# Patient Record
Sex: Female | Born: 1955 | Race: White | Hispanic: No | Marital: Married | Smoking: Former smoker
Health system: Southern US, Community
[De-identification: ages and names within clinical notes are randomized; demographics above are authoritative.]

## PROBLEM LIST (undated history)

## (undated) DIAGNOSIS — I10 Essential (primary) hypertension: Secondary | ICD-10-CM

## (undated) DIAGNOSIS — M1990 Unspecified osteoarthritis, unspecified site: Secondary | ICD-10-CM

## (undated) DIAGNOSIS — E039 Hypothyroidism, unspecified: Secondary | ICD-10-CM

## (undated) DIAGNOSIS — N939 Abnormal uterine and vaginal bleeding, unspecified: Secondary | ICD-10-CM

## (undated) DIAGNOSIS — E109 Type 1 diabetes mellitus without complications: Secondary | ICD-10-CM

## (undated) DIAGNOSIS — F32A Depression, unspecified: Secondary | ICD-10-CM

## (undated) DIAGNOSIS — F329 Major depressive disorder, single episode, unspecified: Secondary | ICD-10-CM

## (undated) DIAGNOSIS — M6530 Trigger finger, unspecified finger: Secondary | ICD-10-CM

## (undated) DIAGNOSIS — R519 Headache, unspecified: Secondary | ICD-10-CM

## (undated) DIAGNOSIS — E785 Hyperlipidemia, unspecified: Secondary | ICD-10-CM

---
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
## 2005-05-01 ENCOUNTER — Ambulatory Visit: Payer: Self-pay | Admitting: Family Medicine

## 2006-02-03 ENCOUNTER — Ambulatory Visit: Payer: Self-pay | Admitting: Unknown Physician Specialty

## 2006-05-06 ENCOUNTER — Ambulatory Visit: Payer: Self-pay | Admitting: Unknown Physician Specialty

## 2006-06-17 ENCOUNTER — Encounter: Payer: Self-pay | Admitting: Specialist

## 2006-06-19 ENCOUNTER — Encounter: Payer: Self-pay | Admitting: Specialist

## 2006-07-18 ENCOUNTER — Encounter: Payer: Self-pay | Admitting: Specialist

## 2006-08-27 ENCOUNTER — Ambulatory Visit: Payer: Self-pay | Admitting: Family Medicine

## 2007-02-11 ENCOUNTER — Ambulatory Visit: Payer: Self-pay | Admitting: Obstetrics and Gynecology

## 2007-09-16 ENCOUNTER — Ambulatory Visit: Payer: Self-pay | Admitting: Family Medicine

## 2007-09-20 ENCOUNTER — Ambulatory Visit: Payer: Self-pay | Admitting: Family Medicine

## 2008-10-31 ENCOUNTER — Ambulatory Visit: Payer: Self-pay | Admitting: Family Medicine

## 2009-10-08 ENCOUNTER — Ambulatory Visit: Payer: Self-pay | Admitting: Internal Medicine

## 2009-10-17 ENCOUNTER — Ambulatory Visit: Payer: Self-pay | Admitting: Internal Medicine

## 2009-10-31 ENCOUNTER — Ambulatory Visit: Payer: Self-pay | Admitting: Internal Medicine

## 2009-11-14 ENCOUNTER — Ambulatory Visit: Payer: Self-pay | Admitting: Internal Medicine

## 2010-06-21 ENCOUNTER — Ambulatory Visit: Payer: Self-pay | Admitting: Family Medicine

## 2010-07-02 ENCOUNTER — Ambulatory Visit: Payer: Self-pay

## 2010-08-06 ENCOUNTER — Ambulatory Visit: Payer: Self-pay | Admitting: Family Medicine

## 2011-08-29 ENCOUNTER — Ambulatory Visit: Payer: Self-pay | Admitting: Family Medicine

## 2013-06-20 ENCOUNTER — Ambulatory Visit: Payer: Self-pay | Admitting: Physician Assistant

## 2013-06-22 LAB — URINE CULTURE

## 2013-11-23 ENCOUNTER — Ambulatory Visit: Payer: Self-pay | Admitting: Family Medicine

## 2013-12-12 ENCOUNTER — Ambulatory Visit: Payer: Self-pay | Admitting: Unknown Physician Specialty

## 2013-12-22 ENCOUNTER — Ambulatory Visit: Payer: Self-pay | Admitting: Unknown Physician Specialty

## 2013-12-26 LAB — PATHOLOGY REPORT

## 2014-03-19 HISTORY — PX: COLONOSCOPY: SHX174

## 2014-04-03 ENCOUNTER — Ambulatory Visit (INDEPENDENT_AMBULATORY_CARE_PROVIDER_SITE_OTHER): Payer: Federal, State, Local not specified - PPO

## 2014-04-03 ENCOUNTER — Ambulatory Visit (INDEPENDENT_AMBULATORY_CARE_PROVIDER_SITE_OTHER): Payer: Federal, State, Local not specified - PPO | Admitting: Podiatry

## 2014-04-03 ENCOUNTER — Encounter: Payer: Self-pay | Admitting: Podiatry

## 2014-04-03 VITALS — BP 135/73 | HR 70 | Resp 16 | Ht 66.0 in | Wt 218.0 lb

## 2014-04-03 DIAGNOSIS — E109 Type 1 diabetes mellitus without complications: Secondary | ICD-10-CM

## 2014-04-03 DIAGNOSIS — M722 Plantar fascial fibromatosis: Secondary | ICD-10-CM

## 2014-04-03 DIAGNOSIS — E1042 Type 1 diabetes mellitus with diabetic polyneuropathy: Secondary | ICD-10-CM

## 2014-04-03 NOTE — Progress Notes (Signed)
   Subjective:    Patient ID: Elaine Nelson, female    DOB: 12/04/1955, 58 y.o.   MRN: [id number]  HPI Comments: i have pain with both feet on the bottom, and pain in between 4th and 5th toes. Ive had the pain for 1 month with the toes and the feet for 2 years. The pain in between the toes are getting worse. Standing will bother me. Shoes do not bother me. The pain is off and on. i soak my feet in epsom salt, and take tylenol.  Foot Pain Associated symptoms include coughing.      Review of Systems  Eyes: Positive for visual disturbance.  Respiratory: Positive for cough.   Endocrine:       Increase urination   Genitourinary: Positive for frequency.  Musculoskeletal:       Joint pain   Psychiatric/Behavioral: The patient is nervous/anxious.   All other systems reviewed and are negative.      Objective:   Physical Exam: I have reviewed her past medical history medications allergies surgery social history and review of systems. Pulses are strongly palpable bilateral. Neurologic sensorium is intact persons once the monofilament.however vibratory sensation is lost.Deep tendon reflexes are intact bilaterally muscle strength is 5 over 5 dorsiflexion plantar flexors and inverters everters all intrinsic musculature is intact. Orthopedic evaluation demonstrates all joints distal to the ankle for range of motion without crepitation. She has pain on palpation medial calcaneal tubercles bilateral with a soft tissue increase in density at the plantar fascial calcaneal insertion site. Radiographic evaluation demonstrates plantar distally or any calcaneal heel spurs and soft tissue increase in density at the plantar fascial calcaneal insertion site.        Assessment & Plan:  Assessment: Plantar fasciitis bilateral. 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  Plan: Injected bilateral heels today with Kenalog and local anesthetic. We discussed  the etiology pathology conservative versus surgical therapies. At this point we discussed appropriate shoe gear stretching exercises ice therapy issue gear modifications. We will reevaluate her in 1 month to determine whether we need to start her on medication for diabetic peripheral neuropathy or if the majority of her pain is associated with the plantar fasciitis.

## 2014-04-18 HISTORY — PX: CATARACT EXTRACTION: SUR2

## 2014-05-01 ENCOUNTER — Ambulatory Visit: Payer: Federal, State, Local not specified - PPO | Admitting: Podiatry

## 2014-05-10 ENCOUNTER — Ambulatory Visit (INDEPENDENT_AMBULATORY_CARE_PROVIDER_SITE_OTHER): Payer: Federal, State, Local not specified - PPO | Admitting: Podiatry

## 2014-05-10 ENCOUNTER — Encounter: Payer: Self-pay | Admitting: Podiatry

## 2014-05-10 DIAGNOSIS — E109 Type 1 diabetes mellitus without complications: Secondary | ICD-10-CM

## 2014-05-10 DIAGNOSIS — M722 Plantar fascial fibromatosis: Secondary | ICD-10-CM

## 2014-05-10 NOTE — Progress Notes (Signed)
She presents today for follow-up of her plantar fasciitis bilateral. She states that she's doing 90% better. She states that the left one is mildly tender. She continues CONSERVATIVE therapies at home.  Objective: Vital signs are stable she is alert and oriented 3 pulses are palpable bilateral. She has no pain on palpation medial calcaneal tubercle of the right heel. She does have pain on palpation medial continued tubercle of the left heel.  Assessment: Plantar fasciitis 90% resolved bilateral mild tenderness plantar fascial left.  Plan: Injected the left heel today with Kenalog and local anesthetic and we'll follow-up with her in 1 month if necessary. She will continue all conservative therapies.

## 2015-02-04 ENCOUNTER — Ambulatory Visit
Admission: EM | Admit: 2015-02-04 | Discharge: 2015-02-04 | Disposition: A | Discharge status: Home / Self Care | Payer: Federal, State, Local not specified - PPO | Attending: Family Medicine | Admitting: Family Medicine

## 2015-02-04 DIAGNOSIS — T22211A Burn of second degree of right forearm, initial encounter: Secondary | ICD-10-CM | POA: Diagnosis not present

## 2015-02-04 HISTORY — DX: Type 1 diabetes mellitus without complications: E10.9

## 2015-02-04 MED ORDER — RETAPAMULIN 1 % EX OINT: TOPICAL_OINTMENT | Freq: Two times a day (BID) | CUTANEOUS | Status: DC
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
## 2015-02-04 NOTE — Discharge Instructions (Signed)
Burn Care Your skin is a natural barrier to infection. It is the largest organ of your body. Burns damage this natural protection. To help prevent infection, it is very important to follow your caregiver's instructions in the care of your burn. Burns are classified as:  First degree. There is only redness of the skin (erythema). No scarring is expected.  Second degree. There is blistering of the skin. Scarring may occur with deeper burns.  Third degree. All layers of the skin are injured, and scarring is expected. HOME CARE INSTRUCTIONS   Wash your hands well before changing your bandage.  Change your bandage as often as directed by your caregiver.  Remove the old bandage. If the bandage sticks, you may soak it off with cool, clean water.  Cleanse the burn thoroughly but gently with mild soap and water.  Pat the area dry with a clean, dry cloth.  Apply a thin layer of antibacterial cream to the burn.  Apply a clean bandage as instructed by your caregiver.  Keep the bandage as clean and dry as possible.  Elevate the affected area for the first 24 hours, then as instructed by your caregiver.  Only take over-the-counter or prescription medicines for pain, discomfort, or fever as directed by your caregiver. SEEK IMMEDIATE MEDICAL CARE IF:   You develop excessive pain.  You develop redness, tenderness, swelling, or red streaks near the burn.  The burned area develops yellowish-white fluid (pus) or a bad smell.  You have a fever. MAKE SURE YOU:   Understand these instructions.  Will watch your condition.  Will get help right away if you are not doing well or get worse. Document Released: 05/05/2005 Document Revised: 07/28/2011 Document Reviewed: 09/25/2010 Exit[employer] Patient Information 2015 [employer]. This information is not intended to replace advice given to you by your health care provider. Make sure you discuss any questions you have with your health care  provider.  
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
## 2017-02-18 ENCOUNTER — Other Ambulatory Visit: Payer: Self-pay | Admitting: Family Medicine

## 2017-02-18 DIAGNOSIS — Z1231 Encounter for screening mammogram for malignant neoplasm of breast: Secondary | ICD-10-CM

## 2017-03-24 ENCOUNTER — Other Ambulatory Visit: Payer: Self-pay | Admitting: Family Medicine

## 2017-03-24 ENCOUNTER — Ambulatory Visit
Admission: RE | Admit: 2017-03-24 | Discharge: 2017-03-24 | Disposition: A | Discharge status: Home / Self Care | Payer: Federal, State, Local not specified - PPO | Source: Ambulatory Visit | Attending: Family Medicine | Admitting: Family Medicine

## 2017-03-24 DIAGNOSIS — Z1231 Encounter for screening mammogram for malignant neoplasm of breast: Secondary | ICD-10-CM

## 2017-03-24 DIAGNOSIS — N6310 Unspecified lump in the right breast, unspecified quadrant: Secondary | ICD-10-CM | POA: Insufficient documentation

## 2017-03-24 DIAGNOSIS — R928 Other abnormal and inconclusive findings on diagnostic imaging of breast: Secondary | ICD-10-CM | POA: Diagnosis not present

## 2017-03-27 ENCOUNTER — Other Ambulatory Visit: Payer: Self-pay | Admitting: Family Medicine

## 2017-03-27 DIAGNOSIS — R928 Other abnormal and inconclusive findings on diagnostic imaging of breast: Secondary | ICD-10-CM

## 2017-03-27 DIAGNOSIS — N6310 Unspecified lump in the right breast, unspecified quadrant: Secondary | ICD-10-CM

## 2017-04-02 ENCOUNTER — Ambulatory Visit
Admission: RE | Admit: 2017-04-02 | Discharge: 2017-04-02 | Disposition: A | Discharge status: Home / Self Care | Payer: Federal, State, Local not specified - PPO | Source: Ambulatory Visit | Attending: Family Medicine | Admitting: Family Medicine

## 2017-04-02 DIAGNOSIS — R928 Other abnormal and inconclusive findings on diagnostic imaging of breast: Secondary | ICD-10-CM

## 2017-04-02 DIAGNOSIS — N6310 Unspecified lump in the right breast, unspecified quadrant: Secondary | ICD-10-CM | POA: Diagnosis present

## 2017-04-02 DIAGNOSIS — N6489 Other specified disorders of breast: Secondary | ICD-10-CM | POA: Insufficient documentation

## 2017-04-02 DIAGNOSIS — R921 Mammographic calcification found on diagnostic imaging of breast: Secondary | ICD-10-CM | POA: Insufficient documentation

## 2017-04-21 ENCOUNTER — Ambulatory Visit: Payer: Federal, State, Local not specified - PPO | Attending: Neurology

## 2017-04-21 DIAGNOSIS — G4733 Obstructive sleep apnea (adult) (pediatric): Secondary | ICD-10-CM | POA: Diagnosis not present

## 2017-04-21 DIAGNOSIS — F5101 Primary insomnia: Secondary | ICD-10-CM | POA: Insufficient documentation

## 2017-04-21 DIAGNOSIS — Z6837 Body mass index (BMI) 37.0-37.9, adult: Secondary | ICD-10-CM | POA: Insufficient documentation

## 2017-04-21 DIAGNOSIS — G4761 Periodic limb movement disorder: Secondary | ICD-10-CM | POA: Insufficient documentation

## 2017-05-18 ENCOUNTER — Ambulatory Visit (INDEPENDENT_AMBULATORY_CARE_PROVIDER_SITE_OTHER): Payer: Federal, State, Local not specified - PPO

## 2017-05-18 ENCOUNTER — Other Ambulatory Visit: Payer: Self-pay

## 2017-05-18 ENCOUNTER — Encounter: Payer: Self-pay | Admitting: Emergency Medicine

## 2017-05-18 ENCOUNTER — Ambulatory Visit
Admission: EM | Admit: 2017-05-18 | Discharge: 2017-05-18 | Disposition: A | Discharge status: Home / Self Care | Payer: Federal, State, Local not specified - PPO | Attending: Emergency Medicine | Admitting: Emergency Medicine

## 2017-05-18 ENCOUNTER — Ambulatory Visit: Payer: Federal, State, Local not specified - PPO

## 2017-05-18 DIAGNOSIS — R0602 Shortness of breath: Secondary | ICD-10-CM

## 2017-05-18 DIAGNOSIS — R05 Cough: Secondary | ICD-10-CM | POA: Diagnosis not present

## 2017-05-18 DIAGNOSIS — R062 Wheezing: Secondary | ICD-10-CM | POA: Diagnosis not present

## 2017-05-18 DIAGNOSIS — J069 Acute upper respiratory infection, unspecified: Secondary | ICD-10-CM

## 2017-05-18 MED ORDER — ALBUTEROL SULFATE HFA 108 (90 BASE) MCG/ACT IN AERS: 1.0000 | INHALATION_SPRAY | Freq: Four times a day (QID) | RESPIRATORY_TRACT | 0 refills | Status: DC | PRN

## 2017-05-18 MED ORDER — HYDROCOD POLST-CPM POLST ER 10-8 MG/5ML PO SUER: 5.0000 mL | Freq: Two times a day (BID) | ORAL | 0 refills | Status: DC

## 2017-05-18 MED ORDER — IPRATROPIUM-ALBUTEROL 0.5-2.5 (3) MG/3ML IN SOLN
3.0000 mL | Freq: Once | RESPIRATORY_TRACT | Status: AC
  Administered 2017-05-18: 3 mL via RESPIRATORY_TRACT

## 2017-05-18 MED ORDER — AZITHROMYCIN 250 MG PO TABS
250.0000 mg | ORAL_TABLET | Freq: Every day | ORAL | 0 refills | Status: DC
Start: 2017-05-18 — End: 2017-06-01

## 2017-05-18 MED ORDER — BENZONATATE 200 MG PO CAPS: ORAL_CAPSULE | ORAL | 0 refills | Status: DC

## 2017-05-18 NOTE — ED Triage Notes (Signed)
Patient c/o dry cough and chest congestion for the past 2 weeks.  Patient denies fevers.

## 2017-05-18 NOTE — ED Provider Notes (Signed)
[facility]    CSN: [id number] Arrival date & time: 05/18/17  4098     History   Chief Complaint Chief Complaint  Patient presents with  . Cough    HPI Elaine Nelson is a 61 y.o. female.   HPI  61 year old female presents with a dry cough and chest congestion for the last 4 weeks but worse over the last 2 weeks.  Her husband has recently diagnosed with pneumonia and was treated here for same.  Type I diabetic and is a smoker.  Besides the cough she has fatigue but denies any fever or chills.  O2 sats on room air 96% she is afebrile.  She is concerned because of wheezing that she has in her chest particularly at nighttime when she lies down to sleep.       Past Medical History:  Diagnosis Date  . Type 1 diabetes (HCC) 1966    There are no active problems to display for this patient.   Past Surgical History:  Procedure Laterality Date  . CATARACT EXTRACTION Bilateral 04/2014  . COLONOSCOPY  03/2014    OB History    No data available       Home Medications    Prior to Admission medications   Medication Sig Start Date End Date Taking? Authorizing Provider  insulin aspart (NOVOLOG) 100 UNIT/ML injection Use as direct in insulin pump up to 90 units per day TDD  ; 250.01 11/07/13  Yes [provider]  levothyroxine (SYNTHROID, LEVOTHROID) 88 MCG tablet Take by mouth. 11/07/13  Yes [provider]  lisinopril (PRINIVIL,ZESTRIL) 20 MG tablet Take by mouth. 11/07/13 05/18/17 Yes [provider]  omeprazole (PRILOSEC) 40 MG capsule Take by mouth. 02/01/14 05/18/17 Yes [provider]  retapamulin (ALTABAX) 1 % ointment Apply topically 2 (two) times daily. 02/04/15  Yes Lutricia Feil, PA-C  sertraline (ZOLOFT) 50 MG tablet Take by mouth. 05/05/11  Yes [provider]  simvastatin (ZOCOR) 40 MG tablet Take by mouth. 11/07/13 05/18/17 Yes [provider]  albuterol (PROVENTIL HFA;VENTOLIN HFA) 108 (90  Base) MCG/ACT inhaler Inhale 1-2 puffs into the lungs every 6 (six) hours as needed for wheezing or shortness of breath. Use with spacer 05/18/17   Lutricia Feil, PA-C  azithromycin (ZITHROMAX) 250 MG tablet Take 1 tablet (250 mg total) by mouth daily. Take first 2 tablets together, then 1 every day until finished. 05/18/17   Lutricia Feil, PA-C  benzonatate (TESSALON) 200 MG capsule Take one cap TID PRN cough 05/18/17   Lutricia Feil, PA-C  chlorpheniramine-HYDROcodone [facility]) 10-8 MG/5ML SUER Take 5 mLs by mouth 2 (two) times daily. 05/18/17   Lutricia Feil, PA-C  ibuprofen (ADVIL,MOTRIN) 200 MG tablet Take 200 mg by mouth every 6 (six) hours as needed.    [provider]    Family History Family History  Problem Relation Age of Onset  . Deep vein thrombosis Mother   . Stroke Father   . Breast cancer Maternal Aunt        mat great aunt  . Breast cancer Cousin        2 pat cousins and one mat cousin    Social History Social History   Tobacco Use  . Smoking status: Current Every Day Smoker    Packs/day: 0.50    Years: 16.00    Pack years: 8.00    Types: Cigarettes  . Smokeless tobacco: Never Used  Substance Use Topics  .  Alcohol use: Yes    Alcohol/week: 0.0 oz    Comment: occasionally  . Drug use: No     Allergies   Sulfa antibiotics   Review of Systems Review of Systems  Constitutional: Positive for activity change. Negative for chills, fatigue and fever.  HENT: Positive for congestion.   Respiratory: Positive for cough, shortness of breath and wheezing.   All other systems reviewed and are negative.    Physical Exam Triage Vital Signs ED Triage Vitals  Enc Vitals Group     BP 05/18/17 0917 (!) 148/64     Pulse Rate 05/18/17 0917 67     Resp 05/18/17 0917 16     Temp 05/18/17 0917 98.9 F (37.2 C)     Temp Source 05/18/17 0917 Oral     SpO2 05/18/17 0917 96 %     Weight 05/18/17 0914 208 lb (94.3 kg)     Height  05/18/17 0914 5' 5.5" (1.664 m)     Head Circumference --      Peak Flow --      Pain Score 05/18/17 0914 0     Pain Loc --      Pain Edu? --      Excl. in GC? --    No data found.  Updated Vital Signs BP (!) 148/64 (BP Location: Left Arm)   Pulse 67   Temp 98.9 F (37.2 C) (Oral)   Resp 16   Ht 5' 5.5" (1.664 m)   Wt 208 lb (94.3 kg)   SpO2 96%   BMI 34.09 kg/m   Visual Acuity Right Eye Distance:   Left Eye Distance:   Bilateral Distance:    Right Eye Near:   Left Eye Near:    Bilateral Near:     Physical Exam  Constitutional: She is oriented to person, place, and time. She appears well-developed and well-nourished. No distress.  HENT:  Head: Normocephalic.  Right Ear: External ear normal.  Left Ear: External ear normal.  Nose: Nose normal.  Mouth/Throat: Oropharynx is clear and moist. No oropharyngeal exudate.  Eyes: Pupils are equal, round, and reactive to light.  Neck: Normal range of motion.  Pulmonary/Chest: Effort normal. She has wheezes. She has rales.  The patient has harsh breath sounds with generalized wheezing rhonchi and crackles occasional.  Musculoskeletal: Normal range of motion.  Neurological: She is alert and oriented to person, place, and time.  Skin: Skin is warm and dry. She is not diaphoretic.  Psychiatric: She has a normal mood and affect. Her behavior is normal. Judgment and thought content normal.  Nursing note and vitals reviewed.    UC Treatments / Results  Labs (all labs ordered are listed, but only abnormal results are displayed) Labs Reviewed - No data to display  EKG  EKG Interpretation None       Radiology Dg Chest 2 View  Result Date: 05/18/2017 CLINICAL DATA:  Cough for 6 weeks EXAM: CHEST  2 VIEW COMPARISON:  CT chest June 21, 2010 FINDINGS: There is no edema or consolidation. The heart size and pulmonary vascularity are normal. No adenopathy. There is mild degenerative change in the thoracic spine. IMPRESSION:  No edema or consolidation. Electronically Signed   By: Bretta BangWilliam  Woodruff III M.D.   On: 05/18/2017 11:06    Procedures Procedures (including critical care time)  Medications Ordered in UC Medications  ipratropium-albuterol (DUONEB) 0.5-2.5 (3) MG/3ML nebulizer solution 3 mL (3 mLs Nebulization Given 05/18/17 1044)   Post DuoNeb treatment  patient breath sounds are improved and she feels better.  Initial Impression / Assessment and Plan / UC Course  I have reviewed the triage vital signs and the nursing notes.  Pertinent labs & imaging results that were available during my care of the patient were reviewed by me and considered in my medical decision making (see chart for details).     Plan: 1. Test/x-ray results and diagnosis reviewed with patient 2. rx as per orders; risks, benefits, potential side effects reviewed with patient 3. Recommend supportive treatment with stopping smoking.  Use albuterol inhaler daily to help with breathing.  Patient declines use of prednisone due to her type 1 diabetes.  Because length of time she has had her symptoms plus the suspicion that she may have an element of COPD but certainly has reactive airway disease today will prescribe azithromycin along with cough suppressants.  If she is not improving she should follow-up with her primary care physician. 4. F/u prn if symptoms worsen or don't improve   Final Clinical Impressions(s) / UC Diagnoses   Final diagnoses:  Acute upper respiratory infection    ED Discharge Orders        Ordered    chlorpheniramine-HYDROcodone (TUSSIONEX PENNKINETIC ER) 10-8 MG/5ML SUER  2 times daily     05/18/17 1128    benzonatate (TESSALON) 200 MG capsule     05/18/17 1128    albuterol (PROVENTIL HFA;VENTOLIN HFA) 108 (90 Base) MCG/ACT inhaler  Every 6 hours PRN    Comments:  Provide spacer and instructions to patient   05/18/17 1128    azithromycin (ZITHROMAX) 250 MG tablet  Daily     05/18/17 1128        Controlled Substance Prescriptions [place] Controlled Substance Registry consulted? Not Applicable   Lutricia FeilRoemer, Keshawna Dix P, PA-C 05/18/17 1207

## 2017-05-22 NOTE — H&P (Signed)
Consult History and Physical   SERVICE: Gynecology   Patient Name: Elaine Nelson Patient MRN:   [id number]  CC: Postmenopausal bleeding  HPI: Elaine Nelson is a 62 y.o. with PMB since 2015, a thickened endometrial strip and a stenotic cervical os. [facility]   Review of Systems: positives in bold GEN:   fevers, chills, weight changes, appetite changes, fatigue, night sweats HEENT:  HA, vision changes, hearing loss, congestion, rhinorrhea, sinus pressure, dysphagia CV:   CP, palpitations PULM:  SOB, cough GI:  abd pain, N/V/D/C GU:  dysuria, urgency, frequency MSK:  arthralgias, myalgias, back pain, swelling SKIN:  rashes, color changes, pallor NEURO:  numbness, weakness, tingling, seizures, dizziness, tremors PSYCH:  depression, anxiety, behavioral problems, confusion  HEME/LYMPH:  easy bruising or bleeding ENDO:  heat/cold intolerance  Past Obstetrical History: OB History    No data available      Past Gynecologic History: No LMP recorded. Patient is postmenopausal.   Past Medical History: Past Medical History:  Diagnosis Date  . Type 1 diabetes (HCC) 1966    Past Surgical History:   Past Surgical History:  Procedure Laterality Date  . CATARACT EXTRACTION Bilateral 04/2014  . COLONOSCOPY  03/2014    Family History:  family history includes Breast cancer in her cousin and maternal aunt; Deep vein thrombosis in her mother; Stroke in her father.  Social History:  Social History   Socioeconomic History  . Marital status: Married    Spouse name: Not on file  . Number of children: Not on file  . Years of education: Not on file  . Highest education level: Not on file  Social Needs  . Financial resource strain: Not on file  . Food insecurity - worry: Not on file  . Food insecurity - inability: Not on file  . Transportation needs - medical: Not on file  . Transportation needs - non-medical: Not on file  Occupational History  .  Not on file  Tobacco Use  . Smoking status: Current Every Day Smoker    Packs/day: 0.50    Years: 16.00    Pack years: 8.00    Types: Cigarettes  . Smokeless tobacco: Never Used  Substance and Sexual Activity  . Alcohol use: Yes    Alcohol/week: 0.0 oz    Comment: occasionally  . Drug use: No  . Sexual activity: Not on file  Other Topics Concern  . Not on file  Social History Narrative  . Not on file    Home Medications:  Medications reconciled in EPIC  No current facility-administered medications on file prior to encounter.    Current Outpatient Medications on File Prior to Encounter  Medication Sig Dispense Refill  . albuterol (PROVENTIL HFA;VENTOLIN HFA) 108 (90 Base) MCG/ACT inhaler Inhale 1-2 puffs into the lungs every 6 (six) hours as needed for wheezing or shortness of breath. Use with spacer 1 Inhaler 0  . azithromycin (ZITHROMAX) 250 MG tablet Take 1 tablet (250 mg total) by mouth daily. Take first 2 tablets together, then 1 every day until finished. 6 tablet 0  . benzonatate (TESSALON) 200 MG capsule Take one cap TID PRN cough 30 capsule 0  . chlorpheniramine-HYDROcodone (TUSSIONEX PENNKINETIC ER) 10-8 MG/5ML SUER Take 5 mLs by mouth 2 (two) times daily. 115 mL 0  . ibuprofen (ADVIL,MOTRIN) 200 MG tablet Take 200 mg by mouth every 6 (six) hours as needed.    . insulin aspart (NOVOLOG) 100 UNIT/ML injection Use as direct in  insulin pump up to 90 units per day TDD  ; 250.01    . levothyroxine (SYNTHROID, LEVOTHROID) 88 MCG tablet Take by mouth.    Marland Kitchen lisinopril (PRINIVIL,ZESTRIL) 20 MG tablet Take by mouth.    Marland Kitchen omeprazole (PRILOSEC) 40 MG capsule Take by mouth.    . retapamulin (ALTABAX) 1 % ointment Apply topically 2 (two) times daily. 15 g 0  . sertraline (ZOLOFT) 50 MG tablet Take by mouth.    . simvastatin (ZOCOR) 40 MG tablet Take by mouth.      Allergies:  Allergies  Allergen Reactions  . Sulfa Antibiotics Rash    Other Reaction: muscle ache    Physical  Exam:  BP: ()/()  Arterial Line BP: ()/()    General Appearance:  Well developed, well nourished, no acute distress, alert and oriented x3 HEENT:  Normocephalic atraumatic, extraocular movements intact, moist mucous membranes Cardiovascular:  Normal S1/S2, regular rate and rhythm, no murmurs Pulmonary:  clear to auscultation, no wheezes, rales or rhonchi, symmetric air entry, good air exchange Abdomen:  Bowel sounds present, soft, nontender, nondistended, no abnormal masses, no epigastric pain Extremities:  Full range of motion, no pedal edema, 2+ distal pulses, no tenderness Skin:  normal coloration and turgor, no rashes, no suspicious skin lesions noted  Neurologic:  Cranial nerves 2-12 grossly intact, normal muscle tone, strength 5/5 all four extremities Psychiatric:  Normal mood and affect, appropriate, no AH/VH Pelvic:  NEFG, no vulvar masses or lesions, normal vaginal mucosa, no vaginal bleeding or discharge, cervix without lesions or erythema,    Labs/Studies:   CBC and Coags: No results found for: WBC, NEUTOPHILPCT, EOSPCT, BASOPCT, LYMPHOPCT, HGB, HCT, MCV, PLT, INR CMP: No results found for: NA, K, CL, CO2, BUN, CREATININE, GLU, PROT, BILITOT, BILIDIR, ALT, AST, ALKPHOS  Other Imaging: Dg Chest 2 View  Result Date: 05/18/2017 CLINICAL DATA:  Cough for 6 weeks EXAM: CHEST  2 VIEW COMPARISON:  CT chest June 21, 2010 FINDINGS: There is no edema or consolidation. The heart size and pulmonary vascularity are normal. No adenopathy. There is mild degenerative change in the thoracic spine. IMPRESSION: No edema or consolidation. Electronically Signed   By: Bretta Bang III M.D.   On: 05/18/2017 11:06     Assessment / Plan:   Elaine Nelson is a 62 y.o.  who presents with PMB  -  Preoperative visit: D&C hysteroscopy, with possible myosure polypectomy. Consents signed today. Risks of surgery were discussed with the patient including but not limited to: bleeding which may  require transfusion; infection which may require antibiotics; injury to uterus or surrounding organs; intrauterine scarring which may impair future fertility; need for additional procedures including laparotomy or laparoscopy; and other postoperative/anesthesia complications. Written informed consent was obtained.  This is a scheduled same-day surgery. She will have a postop visit in 2 weeks to review operative findings and pathology.

## 2017-06-09 ENCOUNTER — Other Ambulatory Visit: Payer: Self-pay

## 2017-06-09 ENCOUNTER — Encounter
Admission: RE | Admit: 2017-06-09 | Discharge: 2017-06-09 | Disposition: A | Discharge status: Home / Self Care | Payer: Federal, State, Local not specified - PPO | Source: Ambulatory Visit | Attending: Obstetrics and Gynecology | Admitting: Obstetrics and Gynecology

## 2017-06-09 DIAGNOSIS — N939 Abnormal uterine and vaginal bleeding, unspecified: Secondary | ICD-10-CM | POA: Insufficient documentation

## 2017-06-09 DIAGNOSIS — I10 Essential (primary) hypertension: Secondary | ICD-10-CM | POA: Insufficient documentation

## 2017-06-09 DIAGNOSIS — Z01812 Encounter for preprocedural laboratory examination: Secondary | ICD-10-CM | POA: Diagnosis not present

## 2017-06-09 DIAGNOSIS — F329 Major depressive disorder, single episode, unspecified: Secondary | ICD-10-CM | POA: Insufficient documentation

## 2017-06-09 DIAGNOSIS — F17200 Nicotine dependence, unspecified, uncomplicated: Secondary | ICD-10-CM | POA: Insufficient documentation

## 2017-06-09 DIAGNOSIS — E109 Type 1 diabetes mellitus without complications: Secondary | ICD-10-CM | POA: Diagnosis not present

## 2017-06-09 DIAGNOSIS — Z01810 Encounter for preprocedural cardiovascular examination: Secondary | ICD-10-CM | POA: Diagnosis not present

## 2017-06-09 HISTORY — DX: Major depressive disorder, single episode, unspecified: F32.9

## 2017-06-09 HISTORY — DX: Hyperlipidemia, unspecified: E78.5

## 2017-06-09 HISTORY — DX: Abnormal uterine and vaginal bleeding, unspecified: N93.9

## 2017-06-09 HISTORY — DX: Essential (primary) hypertension: I10

## 2017-06-09 HISTORY — DX: Depression, unspecified: F32.A

## 2017-06-09 LAB — CBC
HEMATOCRIT: 45.4 % (ref 35.0–47.0)
HEMOGLOBIN: 15 g/dL (ref 12.0–16.0)
MCH: 29.3 pg (ref 26.0–34.0)
MCHC: 33.1 g/dL (ref 32.0–36.0)
MCV: 88.4 fL (ref 80.0–100.0)
Platelets: 238 10*3/uL (ref 150–440)
RBC: 5.13 MIL/uL (ref 3.80–5.20)
RDW: 13.7 % (ref 11.5–14.5)
WBC: 6.7 10*3/uL (ref 3.6–11.0)

## 2017-06-09 LAB — BASIC METABOLIC PANEL
ANION GAP: 8 (ref 5–15)
BUN: 13 mg/dL (ref 6–20)
CO2: 28 mmol/L (ref 22–32)
Calcium: 9.1 mg/dL (ref 8.9–10.3)
Chloride: 100 mmol/L — ABNORMAL LOW (ref 101–111)
Creatinine, Ser: 0.7 mg/dL (ref 0.44–1.00)
GFR calc Af Amer: >60 mL/min (ref >60)
GLUCOSE: 193 mg/dL — AB (ref 65–99)
POTASSIUM: 4.1 mmol/L (ref 3.5–5.1)
Sodium: 136 mmol/L (ref 135–145)

## 2017-06-09 LAB — TYPE AND SCREEN
ABO/RH(D): O POS
ANTIBODY SCREEN: NEGATIVE

## 2017-06-09 NOTE — Patient Instructions (Signed)
Your procedure is scheduled on: 06/15/17 Mon Report to Same Day Surgery 2nd floor medical mall [facility](Medical Mall Entrance-take elevator on left to 2nd floor.  Check in with surgery information desk.) To find out your arrival time please call [phone] between 1PM - 3PM on 06/12/17 Fri Remember: Instructions that are not followed completely may result in serious medical risk, up to and including death, or upon the discretion of your surgeon and anesthesiologist your surgery may need to be rescheduled.    _x___ 1. Do not eat food after midnight the night before your procedure. You may drink clear liquids up to 2 hours before you are scheduled to arrive at the hospital for your procedure.  Do not drink clear liquids within 2 hours of your scheduled arrival to the hospital.  Clear liquids include  --Water or Apple juice without pulp  --Clear carbohydrate beverage such as ClearFast or Gatorade  --Black Coffee or Clear Tea (No milk, no creamers, do not add anything to                  the coffee or Tea Type 1 and type 2 diabetics should only drink water.  No gum chewing or hard candies.     __x__ 2. No Alcohol for 24 hours before or after surgery.   __x__3. No Smoking for 24 prior to surgery.   ____  4. Bring all medications with you on the day of surgery if instructed.    __x__ 5. Notify your doctor if there is any change in your medical condition     (cold, fever, infections).     Do not wear jewelry, make-up, hairpins, clips or nail polish.  Do not wear lotions, powders, or perfumes. You may wear deodorant.  Do not shave 48 hours prior to surgery. Men may shave face and neck.  Do not bring valuables to the hospital.    [facility] any belongings or valuables.               Contacts, dentures or bridgework may not be worn into surgery.  Leave your suitcase in the car. After surgery it may be brought to your room.  For patients admitted to the hospital, discharge  time is determined by your                       treatment team.   Patients discharged the day of surgery will not be allowed to drive home.  You will need someone to drive you home and stay with you the night of your procedure.    Please read over the following fact sheets that you were given:   [facility] Preparing for Surgery and or MRSA Information   _x___ Take anti-hypertensive listed below, cardiac, seizure, asthma,     anti-reflux and psychiatric medicines. These include:  1. FLUoxetine (PROZAC) 20 MG capsule  2.levothyroxine (SYNTHROID, LEVOTHROID) 88 MCG tablet  3.  4.  5.  6.  ____Fleets enema or Magnesium Citrate as directed.   _x___ Use CHG Soap or sage wipes as directed on instruction sheet   ____ Use inhalers on the day of surgery and bring to hospital day of surgery  ____ Stop Metformin and Janumet 2 days prior to surgery.    ____ Take 1/2 of usual insulin dose the night before surgery and none on the morning     surgery.   _x___ Follow recommendations from Cardiologist, Pulmonologist or PCP regarding  stopping Aspirin, Coumadin, Plavix ,Eliquis, Effient, or Pradaxa, and Pletal.  X____Stop Anti-inflammatories such as Advil, Aleve, Ibuprofen, Motrin, Naproxen, Naprosyn, Goodies powders or aspirin products. OK to take Tylenol and                          Celebrex.   _x___ Stop supplements until after surgery.  But may continue Vitamin D, Vitamin B,       and multivitamin.   ____ Bring C-Pap to the hospital.

## 2017-06-15 ENCOUNTER — Encounter
Admission: RE | Disposition: A | Discharge status: Home / Self Care | Payer: Self-pay | Source: Ambulatory Visit | Attending: Obstetrics and Gynecology

## 2017-06-15 ENCOUNTER — Ambulatory Visit
Admission: RE | Admit: 2017-06-15 | Discharge: 2017-06-15 | Disposition: A | Discharge status: Home / Self Care | Payer: Federal, State, Local not specified - PPO | Source: Ambulatory Visit | Attending: Obstetrics and Gynecology | Admitting: Obstetrics and Gynecology

## 2017-06-15 ENCOUNTER — Encounter: Payer: Self-pay | Admitting: Certified Registered Nurse Anesthetist

## 2017-06-15 ENCOUNTER — Ambulatory Visit: Payer: Federal, State, Local not specified - PPO | Admitting: Certified Registered Nurse Anesthetist

## 2017-06-15 DIAGNOSIS — Z794 Long term (current) use of insulin: Secondary | ICD-10-CM | POA: Insufficient documentation

## 2017-06-15 DIAGNOSIS — Z79899 Other long term (current) drug therapy: Secondary | ICD-10-CM | POA: Diagnosis not present

## 2017-06-15 DIAGNOSIS — N950 Postmenopausal bleeding: Secondary | ICD-10-CM | POA: Insufficient documentation

## 2017-06-15 DIAGNOSIS — F17210 Nicotine dependence, cigarettes, uncomplicated: Secondary | ICD-10-CM | POA: Diagnosis not present

## 2017-06-15 DIAGNOSIS — Z539 Procedure and treatment not carried out, unspecified reason: Secondary | ICD-10-CM | POA: Insufficient documentation

## 2017-06-15 DIAGNOSIS — E109 Type 1 diabetes mellitus without complications: Secondary | ICD-10-CM | POA: Insufficient documentation

## 2017-06-15 LAB — ABO/RH: ABO/RH(D): O POS

## 2017-06-15 LAB — GLUCOSE, CAPILLARY: Glucose-Capillary: 246 mg/dL — ABNORMAL HIGH (ref 65–99)

## 2017-06-15 SURGERY — DILATATION & CURETTAGE/HYSTEROSCOPY WITH MYOSURE
Anesthesia: General

## 2017-06-15 MED ORDER — PROPOFOL 10 MG/ML IV BOLUS
INTRAVENOUS | Status: AC
  Filled 2017-06-15: qty 40

## 2017-06-15 MED ORDER — ROCURONIUM BROMIDE 50 MG/5ML IV SOLN
INTRAVENOUS | Status: AC
  Filled 2017-06-15: qty 1

## 2017-06-15 MED ORDER — FAMOTIDINE 20 MG PO TABS
20.0000 mg | ORAL_TABLET | Freq: Once | ORAL | Status: AC
  Administered 2017-06-15: 20 mg via ORAL

## 2017-06-15 MED ORDER — MIDAZOLAM HCL 2 MG/2ML IJ SOLN
INTRAMUSCULAR | Status: AC
  Filled 2017-06-15: qty 2

## 2017-06-15 MED ORDER — DEXAMETHASONE SODIUM PHOSPHATE 10 MG/ML IJ SOLN
INTRAMUSCULAR | Status: AC
  Filled 2017-06-15: qty 1

## 2017-06-15 MED ORDER — ONDANSETRON HCL 4 MG/2ML IJ SOLN
INTRAMUSCULAR | Status: AC
  Filled 2017-06-15: qty 2

## 2017-06-15 MED ORDER — LIDOCAINE HCL (PF) 2 % IJ SOLN
INTRAMUSCULAR | Status: AC
  Filled 2017-06-15: qty 10

## 2017-06-15 MED ORDER — SUCCINYLCHOLINE CHLORIDE 20 MG/ML IJ SOLN
INTRAMUSCULAR | Status: AC
  Filled 2017-06-15: qty 1

## 2017-06-15 MED ORDER — FENTANYL CITRATE (PF) 100 MCG/2ML IJ SOLN
INTRAMUSCULAR | Status: AC
  Filled 2017-06-15: qty 2

## 2017-06-15 MED ORDER — KETOROLAC TROMETHAMINE 30 MG/ML IJ SOLN
INTRAMUSCULAR | Status: AC
  Filled 2017-06-15: qty 1

## 2017-06-15 MED ORDER — SODIUM CHLORIDE 0.9 % IV SOLN
INTRAVENOUS | Status: DC
  Administered 2017-06-15: 14:00:00 via INTRAVENOUS

## 2017-06-15 MED ORDER — FAMOTIDINE 20 MG PO TABS
ORAL_TABLET | ORAL | Status: AC
Start: 2017-06-15 — End: 2017-06-15
  Administered 2017-06-15: 20 mg via ORAL
  Filled 2017-06-15: qty 1

## 2017-06-15 SURGICAL SUPPLY — 17 items
CANISTER SUC SOCK COL 7IN (MISCELLANEOUS) ×3 IMPLANT
CANISTER SUCT 3000ML PPV (MISCELLANEOUS) ×3 IMPLANT
CATH ROBINSON RED A/P 16FR (CATHETERS) ×3 IMPLANT
DEVICE MYOSURE LITE (MISCELLANEOUS) IMPLANT
GLOVE BIO SURGEON STRL SZ7 (GLOVE) ×3 IMPLANT
GLOVE INDICATOR 7.5 STRL GRN (GLOVE) ×3 IMPLANT
GOWN STRL REUS W/ TWL LRG LVL3 (GOWN DISPOSABLE) ×2 IMPLANT
GOWN STRL REUS W/TWL LRG LVL3 (GOWN DISPOSABLE) ×4
KIT TURNOVER CYSTO (KITS) ×3 IMPLANT
PACK DNC HYST (MISCELLANEOUS) ×3 IMPLANT
PAD OB MATERNITY 4.3X12.25 (PERSONAL CARE ITEMS) ×3 IMPLANT
PAD PREP 24X41 OB/GYN DISP (PERSONAL CARE ITEMS) ×3 IMPLANT
SOL .9 NS 3000ML IRR  AL (IV SOLUTION) ×2
SOL .9 NS 3000ML IRR UROMATIC (IV SOLUTION) ×1 IMPLANT
TUBING CONNECTING 10 (TUBING) ×2 IMPLANT
TUBING CONNECTING 10' (TUBING) ×1
TUBING HYSTEROSCOPY DOLPHIN (MISCELLANEOUS) ×3 IMPLANT

## 2017-06-15 NOTE — H&P (View-Only) (Signed)
Pt scheduled for 2:30pm start time, but due to surgery prior to hers the case was postponed until 6pm. We gave her the option of rescheduling, and she chose to reschedule for 7 days, first start case, for her D&C. Given her cervical stenosis and the unknown nature of her endometrial lining, this is reasonable.

## 2017-06-15 NOTE — Progress Notes (Signed)
At the request of the patient's attending nurse, I met with the patient and family and provided emotional and spiritual support. The patient was initially upset by delays, but conversations with the patient and family helped to defuse her irritation.

## 2017-06-15 NOTE — OR Nursing (Signed)
Dr. Dalbert GarnetBeasley in to speak with patient at 5pm and notified her that can get her into OR 6pm - patient advises she does not want to wait.  Dr. Dalbert GarnetBeasley notified OR, and also notified patient she could do her surgery next Monday morning.  IV d/c'd.

## 2017-06-15 NOTE — Anesthesia Preprocedure Evaluation (Addendum)
Anesthesia Evaluation  Patient identified by MRN, date of birth, ID band Patient awake    Reviewed: Allergy & Precautions, NPO status , Patient's Chart, lab work & pertinent test results  History of Anesthesia Complications Negative for: history of anesthetic complications  Airway Mallampati: II  TM Distance: >3 FB Neck ROM: full    Dental  (+) Caps, Dental Advidsory Given, Teeth Intact   Pulmonary neg shortness of breath, neg COPD, neg recent URI, Current Smoker,           Cardiovascular Exercise Tolerance: Good hypertension, Pt. on medications (-) angina(-) CAD, (-) Past MI, (-) Cardiac Stents and (-) CABG (-) dysrhythmias (-) Valvular Problems/Murmurs     Neuro/Psych PSYCHIATRIC DISORDERS Depression negative neurological ROS     GI/Hepatic Neg liver ROS, GERD  ,  Endo/Other  diabetes, Well Controlled, Type 1Hypothyroidism   Renal/GU negative Renal ROS  Female GU complaint     Musculoskeletal negative musculoskeletal ROS (+)   Abdominal   Peds negative pediatric ROS (+)  Hematology negative hematology ROS (+)   Anesthesia Other Findings Past Medical History: No date: Abnormal vaginal bleeding No date: Depression No date: Elevated lipids No date: Hypertension 1966: Type 1 diabetes (HCC)  Reproductive/Obstetrics negative OB ROS                            Anesthesia Physical Anesthesia Plan  ASA: III  Anesthesia Plan: General   Post-op Pain Management:    Induction: Intravenous  PONV Risk Score and Plan: 2 and Ondansetron and Dexamethasone  Airway Management Planned: LMA  Additional Equipment:   Intra-op Plan:   Post-operative Plan: Extubation in OR  Informed Consent: I have reviewed the patients History and Physical, chart, labs and discussed the procedure including the risks, benefits and alternatives for the proposed anesthesia with the patient or authorized  representative who has indicated his/her understanding and acceptance.   Dental advisory given  Plan Discussed with: CRNA and Surgeon  Anesthesia Plan Comments:        Anesthesia Quick Evaluation

## 2017-06-15 NOTE — Progress Notes (Signed)
Pt scheduled for 2:30pm start time, but due to surgery prior to hers the case was postponed until 6pm. We gave her the option of rescheduling, and she chose to reschedule for 7 days, first start case, for her D&C. Given her cervical stenosis and the unknown nature of her endometrial lining, this is reasonable. 

## 2017-06-18 MED ORDER — ONDANSETRON HCL 4 MG/2ML IJ SOLN: 4.0000 mg | Freq: Once | INTRAMUSCULAR | Status: DC | PRN

## 2017-06-18 MED ORDER — FENTANYL CITRATE (PF) 100 MCG/2ML IJ SOLN: 25.0000 ug | INTRAMUSCULAR | Status: DC | PRN

## 2017-06-19 MED ORDER — LACTATED RINGERS IV SOLN: INTRAVENOUS | Status: DC

## 2017-06-22 ENCOUNTER — Ambulatory Visit: Payer: Federal, State, Local not specified - PPO | Admitting: Registered Nurse

## 2017-06-22 ENCOUNTER — Ambulatory Visit
Admission: RE | Admit: 2017-06-22 | Discharge: 2017-06-22 | Disposition: A | Discharge status: Home / Self Care | Payer: Federal, State, Local not specified - PPO | Source: Ambulatory Visit | Attending: Obstetrics and Gynecology | Admitting: Obstetrics and Gynecology

## 2017-06-22 ENCOUNTER — Encounter: Payer: Self-pay | Admitting: *Deleted

## 2017-06-22 ENCOUNTER — Other Ambulatory Visit: Payer: Self-pay

## 2017-06-22 ENCOUNTER — Encounter
Admission: RE | Disposition: A | Discharge status: Home / Self Care | Payer: Self-pay | Source: Ambulatory Visit | Attending: Obstetrics and Gynecology

## 2017-06-22 DIAGNOSIS — N858 Other specified noninflammatory disorders of uterus: Secondary | ICD-10-CM | POA: Diagnosis not present

## 2017-06-22 DIAGNOSIS — N840 Polyp of corpus uteri: Secondary | ICD-10-CM | POA: Insufficient documentation

## 2017-06-22 DIAGNOSIS — F329 Major depressive disorder, single episode, unspecified: Secondary | ICD-10-CM | POA: Insufficient documentation

## 2017-06-22 DIAGNOSIS — Z79899 Other long term (current) drug therapy: Secondary | ICD-10-CM | POA: Diagnosis not present

## 2017-06-22 DIAGNOSIS — E039 Hypothyroidism, unspecified: Secondary | ICD-10-CM | POA: Insufficient documentation

## 2017-06-22 DIAGNOSIS — F17200 Nicotine dependence, unspecified, uncomplicated: Secondary | ICD-10-CM | POA: Insufficient documentation

## 2017-06-22 DIAGNOSIS — I10 Essential (primary) hypertension: Secondary | ICD-10-CM | POA: Insufficient documentation

## 2017-06-22 DIAGNOSIS — N950 Postmenopausal bleeding: Secondary | ICD-10-CM | POA: Insufficient documentation

## 2017-06-22 DIAGNOSIS — E109 Type 1 diabetes mellitus without complications: Secondary | ICD-10-CM | POA: Insufficient documentation

## 2017-06-22 DIAGNOSIS — K219 Gastro-esophageal reflux disease without esophagitis: Secondary | ICD-10-CM | POA: Insufficient documentation

## 2017-06-22 HISTORY — PX: DILATATION & CURETTAGE/HYSTEROSCOPY WITH MYOSURE: SHX6511

## 2017-06-22 LAB — GLUCOSE, CAPILLARY
GLUCOSE-CAPILLARY: 185 mg/dL — AB (ref 65–99)
Glucose-Capillary: 211 mg/dL — ABNORMAL HIGH (ref 65–99)

## 2017-06-22 LAB — TYPE AND SCREEN
ABO/RH(D): O POS
Antibody Screen: NEGATIVE

## 2017-06-22 SURGERY — DILATATION & CURETTAGE/HYSTEROSCOPY WITH MYOSURE
Anesthesia: General | Wound class: Clean Contaminated

## 2017-06-22 MED ORDER — FAMOTIDINE 20 MG PO TABS
20.0000 mg | ORAL_TABLET | Freq: Once | ORAL | Status: AC
  Administered 2017-06-22: 20 mg via ORAL

## 2017-06-22 MED ORDER — PROPOFOL 10 MG/ML IV BOLUS
INTRAVENOUS | Status: AC
  Filled 2017-06-22: qty 20

## 2017-06-22 MED ORDER — PROPOFOL 10 MG/ML IV BOLUS
INTRAVENOUS | Status: DC | PRN
  Administered 2017-06-22: 200 mg via INTRAVENOUS

## 2017-06-22 MED ORDER — FENTANYL CITRATE (PF) 100 MCG/2ML IJ SOLN
INTRAMUSCULAR | Status: DC | PRN
  Administered 2017-06-22: 50 ug via INTRAVENOUS
  Administered 2017-06-22 (×2): 25 ug via INTRAVENOUS

## 2017-06-22 MED ORDER — DEXAMETHASONE SODIUM PHOSPHATE 10 MG/ML IJ SOLN
INTRAMUSCULAR | Status: DC | PRN
  Administered 2017-06-22: 5 mg via INTRAVENOUS

## 2017-06-22 MED ORDER — DOCUSATE SODIUM 100 MG PO CAPS: 100.0000 mg | ORAL_CAPSULE | Freq: Two times a day (BID) | ORAL | 0 refills | Status: AC

## 2017-06-22 MED ORDER — SODIUM CHLORIDE 0.9 % IV SOLN
INTRAVENOUS | Status: DC
  Administered 2017-06-22: 07:00:00 via INTRAVENOUS

## 2017-06-22 MED ORDER — EPHEDRINE SULFATE 50 MG/ML IJ SOLN
INTRAMUSCULAR | Status: DC | PRN
  Administered 2017-06-22: 10 mg via INTRAVENOUS

## 2017-06-22 MED ORDER — LIDOCAINE HCL (PF) 2 % IJ SOLN
INTRAMUSCULAR | Status: AC
  Filled 2017-06-22: qty 10

## 2017-06-22 MED ORDER — IBUPROFEN 800 MG PO TABS: 800.0000 mg | ORAL_TABLET | Freq: Three times a day (TID) | ORAL | 1 refills | Status: AC | PRN

## 2017-06-22 MED ORDER — ONDANSETRON HCL 4 MG/2ML IJ SOLN
INTRAMUSCULAR | Status: DC | PRN
  Administered 2017-06-22: 4 mg via INTRAVENOUS

## 2017-06-22 MED ORDER — LIDOCAINE HCL (CARDIAC) 20 MG/ML IV SOLN
INTRAVENOUS | Status: DC | PRN
  Administered 2017-06-22: 100 mg via INTRAVENOUS

## 2017-06-22 MED ORDER — PROMETHAZINE HCL 25 MG/ML IJ SOLN: 6.2500 mg | INTRAMUSCULAR | Status: DC | PRN

## 2017-06-22 MED ORDER — FENTANYL CITRATE (PF) 100 MCG/2ML IJ SOLN
INTRAMUSCULAR | Status: AC
  Filled 2017-06-22: qty 2

## 2017-06-22 MED ORDER — ONDANSETRON HCL 4 MG/2ML IJ SOLN
INTRAMUSCULAR | Status: AC
  Filled 2017-06-22: qty 2

## 2017-06-22 MED ORDER — MIDAZOLAM HCL 2 MG/2ML IJ SOLN
INTRAMUSCULAR | Status: AC
  Filled 2017-06-22: qty 2

## 2017-06-22 MED ORDER — DEXAMETHASONE SODIUM PHOSPHATE 10 MG/ML IJ SOLN
INTRAMUSCULAR | Status: AC
  Filled 2017-06-22: qty 1

## 2017-06-22 MED ORDER — FENTANYL CITRATE (PF) 100 MCG/2ML IJ SOLN: 25.0000 ug | INTRAMUSCULAR | Status: DC | PRN

## 2017-06-22 MED ORDER — ROCURONIUM BROMIDE 50 MG/5ML IV SOLN
INTRAVENOUS | Status: AC
  Filled 2017-06-22: qty 1

## 2017-06-22 MED ORDER — FAMOTIDINE 20 MG PO TABS
ORAL_TABLET | ORAL | Status: AC
  Filled 2017-06-22: qty 1

## 2017-06-22 SURGICAL SUPPLY — 17 items
CANISTER SUC SOCK COL 7IN (MISCELLANEOUS) ×3 IMPLANT
CANISTER SUCT 3000ML PPV (MISCELLANEOUS) ×3 IMPLANT
CATH ROBINSON RED A/P 16FR (CATHETERS) ×3 IMPLANT
DEVICE MYOSURE LITE (MISCELLANEOUS) ×3 IMPLANT
GLOVE BIO SURGEON STRL SZ7 (GLOVE) ×9 IMPLANT
GLOVE INDICATOR 7.5 STRL GRN (GLOVE) ×9 IMPLANT
GOWN STRL REUS W/ TWL LRG LVL3 (GOWN DISPOSABLE) ×2 IMPLANT
GOWN STRL REUS W/TWL LRG LVL3 (GOWN DISPOSABLE) ×4
KIT TURNOVER CYSTO (KITS) ×3 IMPLANT
PACK DNC HYST (MISCELLANEOUS) ×3 IMPLANT
PAD OB MATERNITY 4.3X12.25 (PERSONAL CARE ITEMS) ×3 IMPLANT
PAD PREP 24X41 OB/GYN DISP (PERSONAL CARE ITEMS) ×3 IMPLANT
SOL .9 NS 3000ML IRR  AL (IV SOLUTION) ×2
SOL .9 NS 3000ML IRR UROMATIC (IV SOLUTION) ×1 IMPLANT
TUBING CONNECTING 10 (TUBING) ×2 IMPLANT
TUBING CONNECTING 10' (TUBING) ×1
TUBING HYSTEROSCOPY DOLPHIN (MISCELLANEOUS) ×3 IMPLANT

## 2017-06-22 NOTE — Anesthesia Postprocedure Evaluation (Signed)
Anesthesia Post Note  Patient: Elaine Nelson  Procedure(s) Performed: FRACTIONAL DILATATION & CURETTAGE/HYSTEROSCOPY WITH MYOSURE POLYPECTOMY (N/A )  Patient location during evaluation: PACU Anesthesia Type: General Level of consciousness: awake and alert Pain management: pain level controlled Vital Signs Assessment: post-procedure vital signs reviewed and stable Respiratory status: spontaneous breathing, nonlabored ventilation, respiratory function stable and patient connected to nasal cannula oxygen Cardiovascular status: blood pressure returned to baseline and stable Postop Assessment: no apparent nausea or vomiting Anesthetic complications: no     Last Vitals:  Vitals:   06/22/17 1034 06/22/17 1100  BP: (!) 158/72 (!) 159/76  Pulse: 73 94  Resp:    Temp:    SpO2: 98% 100%    Last Pain:  Vitals:   06/22/17 1007  TempSrc: Temporal  PainSc: 2                  Lenard SimmerAndrew Evangelene Vora

## 2017-06-22 NOTE — Interval H&P Note (Signed)
History and Physical Interval Note:  06/22/2017 7:52 AM  Elaine Nelson  has presented today for surgery, with the diagnosis of postmenopausal bleeding  The various methods of treatment have been discussed with the patient and family. After consideration of risks, benefits and other options for treatment, the patient has consented to  Procedure(s): DILATATION & CURETTAGE/HYSTEROSCOPY WITH MYOSURE (N/A) as a surgical intervention .  The patient's history has been reviewed, patient examined, no change in status, stable for surgery.  I have reviewed the patient's chart and labs.  Questions were answered to the patient's satisfaction.     Christeen DouglasBethany Anselma Herbel

## 2017-06-22 NOTE — Anesthesia Procedure Notes (Signed)
Procedure Name: LMA Insertion Performed by: Rodrickus Min E, CRNA Pre-anesthesia Checklist: Patient identified, Emergency Drugs available, Suction available, Patient being monitored and Timeout performed Patient Re-evaluated:Patient Re-evaluated prior to induction Oxygen Delivery Method: Circle system utilized and Simple face mask Preoxygenation: Pre-oxygenation with 100% oxygen Induction Type: IV induction Ventilation: Mask ventilation without difficulty LMA: LMA inserted LMA Size: 4.0 Number of attempts: 1        

## 2017-06-22 NOTE — Anesthesia Preprocedure Evaluation (Signed)
Anesthesia Evaluation  Patient identified by MRN, date of birth, ID band Patient awake    Reviewed: Allergy & Precautions, NPO status , Patient's Chart, lab work & pertinent test results  History of Anesthesia Complications Negative for: history of anesthetic complications  Airway Mallampati: II  TM Distance: >3 FB Neck ROM: full    Dental  (+) Caps, Dental Advidsory Given, Teeth Intact   Pulmonary neg shortness of breath, neg COPD, neg recent URI, Current Smoker,           Cardiovascular Exercise Tolerance: Good hypertension, Pt. on medications (-) angina(-) CAD, (-) Past MI, (-) Cardiac Stents and (-) CABG (-) dysrhythmias (-) Valvular Problems/Murmurs     Neuro/Psych PSYCHIATRIC DISORDERS Depression negative neurological ROS     GI/Hepatic Neg liver ROS, GERD  ,  Endo/Other  diabetes, Well Controlled, Type 1Hypothyroidism   Renal/GU negative Renal ROS  Female GU complaint     Musculoskeletal negative musculoskeletal ROS (+)   Abdominal   Peds negative pediatric ROS (+)  Hematology negative hematology ROS (+)   Anesthesia Other Findings Past Medical History: No date: Abnormal vaginal bleeding No date: Depression No date: Elevated lipids No date: Hypertension 1966: Type 1 diabetes (HCC)  Reproductive/Obstetrics negative OB ROS                            Anesthesia Physical Anesthesia Plan  ASA: III  Anesthesia Plan: General   Post-op Pain Management:    Induction: Intravenous  PONV Risk Score and Plan: 2 and Ondansetron and Dexamethasone  Airway Management Planned: LMA  Additional Equipment:   Intra-op Plan:   Post-operative Plan: Extubation in OR  Informed Consent: I have reviewed the patients History and Physical, chart, labs and discussed the procedure including the risks, benefits and alternatives for the proposed anesthesia with the patient or authorized  representative who has indicated his/her understanding and acceptance.   Dental advisory given  Plan Discussed with: CRNA and Surgeon  Anesthesia Plan Comments:        Anesthesia Quick Evaluation  

## 2017-06-22 NOTE — Anesthesia Post-op Follow-up Note (Signed)
Anesthesia QCDR form completed.        

## 2017-06-22 NOTE — Transfer of Care (Signed)
Immediate Anesthesia Transfer of Care Note  Patient: Elaine Nelson  Procedure(s) Performed: FRACTIONAL DILATATION & CURETTAGE/HYSTEROSCOPY WITH MYOSURE POLYPECTOMY (N/A )  Patient Location: PACU  Anesthesia Type:General  Level of Consciousness: awake  Airway & Oxygen Therapy: Patient Spontanous Breathing  Post-op Assessment: Report given to RN  Post vital signs: Reviewed and stable  Last Vitals:  Vitals:   06/22/17 0614  BP: (!) 154/83  Pulse: 68  Resp: 18  Temp: (!) 36.2 C  SpO2: 98%    Last Pain:  Vitals:   06/22/17 0614  TempSrc: Tympanic         Complications: No apparent anesthesia complications

## 2017-06-22 NOTE — Anesthesia Procedure Notes (Signed)
Performed by: Katherine Tout E, CRNA       

## 2017-06-22 NOTE — Op Note (Signed)
Operative Report Hysteroscopy with Dilation and Curettage   Indications: Postmenopausal bleeding   Pre-operative Diagnosis: Thickened endometrial stripe   Post-operative Diagnosis: Endometrial polyp.  Procedure: 1. Exam under anesthesia 2. Fractional D&C 3. Hysteroscopy 4. Myosure polypectomy  Surgeon: Christeen DouglasBethany Diane Mochizuki, MD  Assistant(s):  None  Anesthesia: General LMA anesthesia  Anesthesiologist: Lenard SimmerKarenz, Andrew, MD Anesthesiologist: Lenard SimmerKarenz, Andrew, MD CRNA: Dava NajjarFrazier, Susan, CRNA; Danelle BerryWarr, Barbara E, CRNA  Estimated Blood Loss:  Minimal                Total IV Fluids: 800ml  Urine Output: 100ml  Total Fluid Deficit:  60 mL          Specimens: Endocervical curettings, endometrial curettings         Complications:  None; patient tolerated the procedure well.         Disposition: PACU - hemodynamically stable.         Condition: stable  Findings: Uterus measuring 11 cm by sound; normal cervix, vagina, perineum. Large single endometrial polyp with broad base.  Indication for procedure/Consents: 62 y.o. F here for scheduled surgery for the aforementioned diagnoses.  Risks of surgery were discussed with the patient including but not limited to: bleeding which may require transfusion; infection which may require antibiotics; injury to uterus or surrounding organs; intrauterine scarring which may impair future fertility; need for additional procedures including laparotomy or laparoscopy; and other postoperative/anesthesia complications. Written informed consent was obtained.    Procedure Details:    D&C/ Myosure  The patient was taken to the operating room where anesthesia was administered and was found to be adequate. After a formal and adequate timeout was performed, she was placed in the dorsal lithotomy position and examined with the above findings. She was then prepped and draped in the sterile manner. Her bladder was catheterized for an estimated amount of clear, yellow  urine. A weighed speculum was then placed in the patient's vagina and a single tooth tenaculum was applied to the anterior lip of the cervix.  Her cervix was serially dilated to 15 [place]French using Hanks dilators. An ECC was performed. The hysteroscope was introduced under direct observation  Using lactated ringers as a distention medium to reveal the above findings. The uterine cavity was carefully examined, both ostia were recognized, and atrophic endometrium with the polyp above were noted.   This was resected using the Myosure device.  After further careful visualization of the uterine cavity, the hysteroscope was removed under direct visualization.  A sharp curettage was then performed until there was a gritty texture in all four quadrants. The tenaculum was removed from the anterior lip of the cervix and the vaginal speculum was removed after applying silver nitrate for good hemostasis.   The patient tolerated the procedure well and was taken to the recovery area awake and in stable condition. She received iv acetaminophen and Toradol prior to leaving the OR.  The patient will be discharged to home as per PACU criteria. Routine postoperative instructions given. She was prescribed Ibuprofen and Colace. She will follow up in the clinic in two weeks for postoperative evaluation.

## 2017-06-24 LAB — SURGICAL PATHOLOGY

## 2017-06-30 ENCOUNTER — Ambulatory Visit: Payer: Federal, State, Local not specified - PPO | Attending: Neurology

## 2017-06-30 DIAGNOSIS — G4733 Obstructive sleep apnea (adult) (pediatric): Secondary | ICD-10-CM | POA: Insufficient documentation

## 2018-02-08 ENCOUNTER — Other Ambulatory Visit: Payer: Self-pay

## 2018-02-08 ENCOUNTER — Ambulatory Visit
Admission: EM | Admit: 2018-02-08 | Discharge: 2018-02-08 | Disposition: A | Discharge status: Home / Self Care | Payer: Federal, State, Local not specified - PPO | Attending: Internal Medicine | Admitting: Internal Medicine

## 2018-02-08 ENCOUNTER — Encounter: Payer: Self-pay | Admitting: Emergency Medicine

## 2018-02-08 DIAGNOSIS — R300 Dysuria: Secondary | ICD-10-CM | POA: Diagnosis not present

## 2018-02-08 DIAGNOSIS — R338 Other retention of urine: Secondary | ICD-10-CM

## 2018-02-08 DIAGNOSIS — R350 Frequency of micturition: Secondary | ICD-10-CM

## 2018-02-08 LAB — URINALYSIS, COMPLETE (UACMP) WITH MICROSCOPIC
BACTERIA UA: NONE SEEN
BILIRUBIN URINE: NEGATIVE
Glucose, UA: NEGATIVE mg/dL
Ketones, ur: NEGATIVE mg/dL
NITRITE: NEGATIVE
PROTEIN: NEGATIVE mg/dL
Specific Gravity, Urine: <1.005 — ABNORMAL LOW (ref 1.005–1.030)
pH: 7 (ref 5.0–8.0)

## 2018-02-08 MED ORDER — CEPHALEXIN 500 MG PO CAPS: 500.0000 mg | ORAL_CAPSULE | Freq: Two times a day (BID) | ORAL | 0 refills | Status: AC

## 2018-02-08 NOTE — ED Provider Notes (Signed)
[facility]    CSN: [id number] Arrival date & time: 02/08/18  1335     History   Chief Complaint Chief Complaint  Patient presents with  . Dysuria    HPI Elaine Nelson is a 62 y.o. female.   HPI  62 year old female complains of dysuria frequency and urinary retention as well as and and insatiety and actually used apple cider vinegar externally which she states has decreased her dysuria immensely. Denies Fever or chills has not had any nausea or vomiting.  Denies any back pain.  No vaginal discharge.  She is afebrile.        Past Medical History:  Diagnosis Date  . Abnormal vaginal bleeding   . Depression   . Elevated lipids   . Hypertension   . Type 1 diabetes (HCC) 1966    There are no active problems to display for this patient.   Past Surgical History:  Procedure Laterality Date  . CATARACT EXTRACTION Bilateral 04/2014  . COLONOSCOPY  03/2014  . DILATATION & CURETTAGE/HYSTEROSCOPY WITH MYOSURE N/A 06/22/2017   Procedure: FRACTIONAL DILATATION & CURETTAGE/HYSTEROSCOPY WITH MYOSURE POLYPECTOMY;  Surgeon: Christeen Douglas, MD;  Location: [facility] ORS;  Service: Gynecology;  Laterality: N/A;    OB History   None      Home Medications    Prior to Admission medications   Medication Sig Start Date End Date Taking? Authorizing Provider  docusate sodium (COLACE) 100 MG capsule Take 1 capsule (100 mg total) by mouth 2 (two) times daily. To keep stools soft 06/22/17  Yes Christeen Douglas, MD  FLUoxetine (PROZAC) 20 MG capsule Take 20 mg by mouth daily.   Yes [provider]  ibuprofen (ADVIL,MOTRIN) 800 MG tablet Take 1 tablet (800 mg total) by mouth every 8 (eight) hours as needed for moderate pain. 06/22/17  Yes Christeen Douglas, MD  insulin aspart (NOVOLOG) 100 UNIT/ML injection Inject 90 Units into the skin See admin instructions. Use as direct in insulin pump up to 90 units per day TDD  ; 250.01 11/07/13  Yes [provider]    levothyroxine (SYNTHROID, LEVOTHROID) 88 MCG tablet Take 88 mcg by mouth daily before breakfast.  11/07/13  Yes [provider]  lisinopril (PRINIVIL,ZESTRIL) 20 MG tablet Take 20 mg by mouth daily.  11/07/13 02/08/18 Yes [provider]  simvastatin (ZOCOR) 40 MG tablet Take 40 mg by mouth daily at 6 PM.  11/07/13 02/08/18 Yes [provider]  cephALEXin (KEFLEX) 500 MG capsule Take 1 capsule (500 mg total) by mouth 2 (two) times daily. 02/08/18   Lutricia Feil, PA-C  ibuprofen (ADVIL,MOTRIN) 200 MG tablet Take 800 mg by mouth every 6 (six) hours as needed for headache or moderate pain.     [provider]    Family History Family History  Problem Relation Age of Onset  . Deep vein thrombosis Mother   . Stroke Father   . Breast cancer Maternal Aunt        mat great aunt  . Breast cancer Cousin        2 pat cousins and one mat cousin    Social History Social History   Tobacco Use  . Smoking status: Current Every Day Smoker    Packs/day: 0.50    Years: 16.00    Pack years: 8.00    Types: Cigarettes  . Smokeless tobacco: Never Used  Substance Use Topics  . Alcohol use: Yes    Alcohol/week: 0.0 standard drinks  Comment: occasionally  . Drug use: No     Allergies   Sulfa antibiotics   Review of Systems Review of Systems  Constitutional: Positive for activity change. Negative for appetite change, chills, fatigue and fever.  Genitourinary: Positive for dysuria, frequency and urgency. Negative for vaginal discharge.  All other systems reviewed and are negative.    Physical Exam Triage Vital Signs ED Triage Vitals  Enc Vitals Group     BP 02/08/18 1504 (!) 156/78     Pulse Rate 02/08/18 1504 77     Resp 02/08/18 1504 16     Temp 02/08/18 1504 98.6 F (37 C)     Temp Source 02/08/18 1504 Oral     SpO2 02/08/18 1504 98 %     Weight 02/08/18 1502 211 lb (95.7 kg)     Height 02/08/18 1502 5\' 5"  (1.651 m)     Head Circumference --       Peak Flow --      Pain Score 02/08/18 1501 8     Pain Loc --      Pain Edu? --      Excl. in GC? --    No data found.  Updated Vital Signs BP (!) 156/78 (BP Location: Left Arm)   Pulse 77   Temp 98.6 F (37 C) (Oral)   Resp 16   Ht 5\' 5"  (1.651 m)   Wt 211 lb (95.7 kg)   SpO2 98%   BMI 35.11 kg/m   Visual Acuity Right Eye Distance:   Left Eye Distance:   Bilateral Distance:    Right Eye Near:   Left Eye Near:    Bilateral Near:     Physical Exam  Constitutional: She is oriented to person, place, and time. She appears well-developed and well-nourished. No distress.  HENT:  Head: Normocephalic.  Eyes: Pupils are equal, round, and reactive to light. Right eye exhibits no discharge. Left eye exhibits no discharge.  Neck: Normal range of motion.  Abdominal: Soft. Bowel sounds are normal.  Musculoskeletal: Normal range of motion.  Neurological: She is alert and oriented to person, place, and time.  Skin: Skin is warm and dry. She is not diaphoretic.  Psychiatric: She has a normal mood and affect. Her behavior is normal. Judgment and thought content normal.  Nursing note and vitals reviewed.    UC Treatments / Results  Labs (all labs ordered are listed, but only abnormal results are displayed) Labs Reviewed  URINALYSIS, COMPLETE (UACMP) WITH MICROSCOPIC - Abnormal; Notable for the following components:      Result Value   Specific Gravity, Urine <1.005 (*)    Hgb urine dipstick TRACE (*)    Leukocytes, UA SMALL (*)    All other components within normal limits  URINE CULTURE    EKG None  Radiology No results found.  Procedures Procedures (including critical care time)  Medications Ordered in UC Medications - No data to display  Initial Impression / Assessment and Plan / UC Course  I have reviewed the triage vital signs and the nursing notes.  Pertinent labs & imaging results that were available during my care of the patient were reviewed by me and  considered in my medical decision making (see chart for details).    I explained to the patient that she likely has a UTI.  We will culture and and perform sensitivities on her urine.  Allergic to sulfa drugs.  We will start her on Keflex 500 mg twice daily  for 5 days.  She is not complaining of dysuria as much as when it first started and therefore does not require Pyridium. Final Clinical Impressions(s) / UC Diagnoses   Final diagnoses:  Dysuria   Discharge Instructions   None    ED Prescriptions    Medication Sig Dispense Auth. Provider   cephALEXin (KEFLEX) 500 MG capsule Take 1 capsule (500 mg total) by mouth 2 (two) times daily. 10 capsule Ryan Palermo PLutricia Feil, PA-C     Controlled Substance Prescriptions [place] Controlled Substance Registry consulted? Not Applicable   Lutricia FeilRoemer, Valori Hollenkamp P, PA-C 02/08/18 [id number]

## 2018-02-08 NOTE — ED Triage Notes (Signed)
Pt c/o dysuria, frequency, and retention. Started 4 days ago. She has been drinking cranberry juice and has been helping some.

## 2018-06-14 ENCOUNTER — Other Ambulatory Visit: Payer: Self-pay | Admitting: Family Medicine

## 2018-06-14 DIAGNOSIS — Z1231 Encounter for screening mammogram for malignant neoplasm of breast: Secondary | ICD-10-CM

## 2018-06-30 ENCOUNTER — Ambulatory Visit
Admission: RE | Admit: 2018-06-30 | Discharge: 2018-06-30 | Disposition: A | Discharge status: Home / Self Care | Payer: Federal, State, Local not specified - PPO | Source: Ambulatory Visit | Attending: Family Medicine | Admitting: Family Medicine

## 2018-06-30 DIAGNOSIS — Z1231 Encounter for screening mammogram for malignant neoplasm of breast: Secondary | ICD-10-CM | POA: Diagnosis not present

## 2018-08-13 IMAGING — US US BREAST*R* LIMITED INC AXILLA
1 series · 4 of 4 positions shown · non-contrast
Comparison: 03/24/2017, 11/23/2013 and earlier.

CLINICAL DATA: Recall from screening mammography with
tomosynthesis, possible mass or focal asymmetry in the upper right
breast at middle depth.

EXAM:
2D DIGITAL DIAGNOSTIC RIGHT MAMMOGRAM WITH ADJUNCT TOMO
ULTRASOUND RIGHT BREAST

[Series 1: us breast*right* limited inc axilla · 0.09mm/px · 4 of 4 slices shown]
[im 1/4]
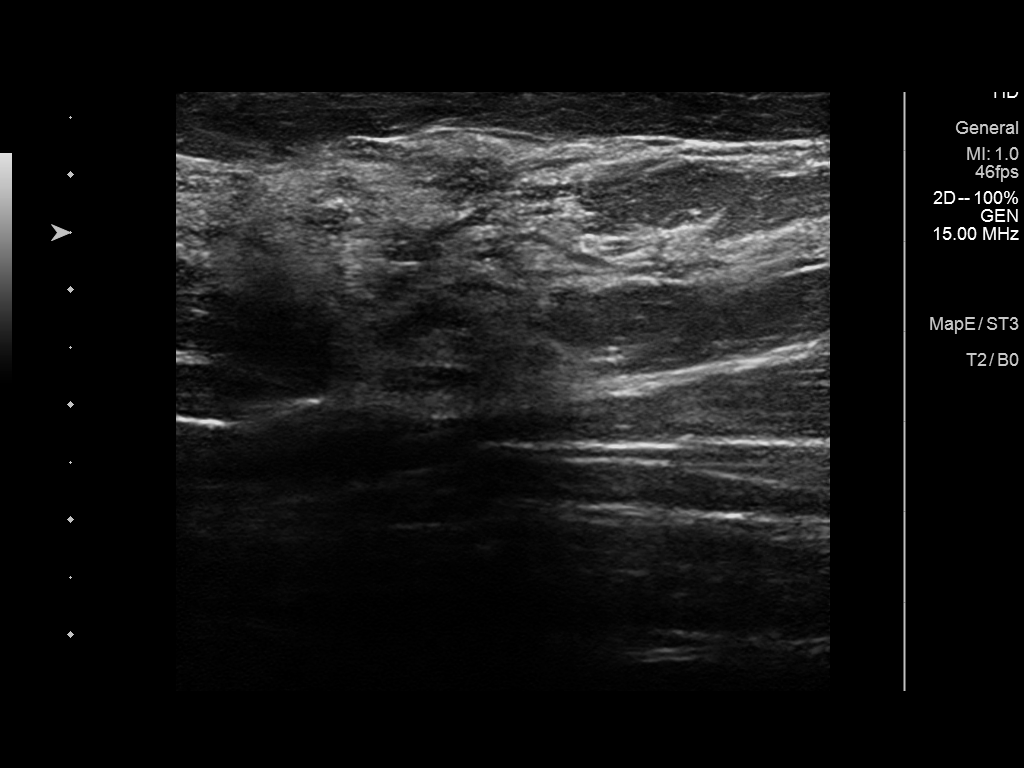
[im 2/4]
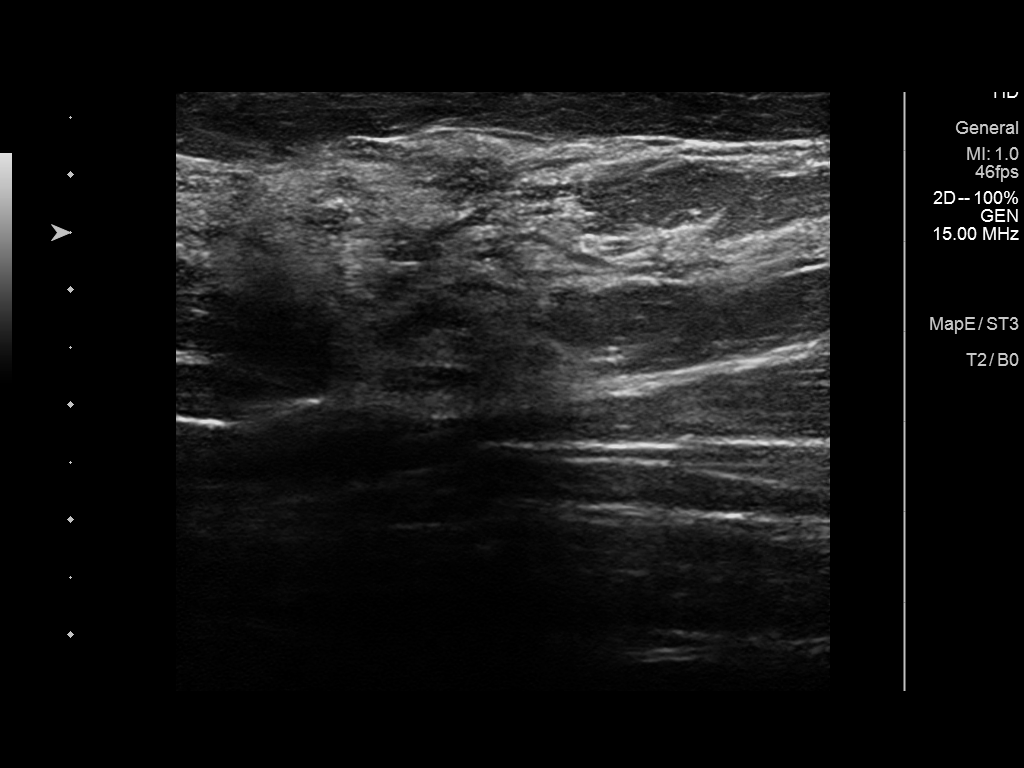
[im 3/4]
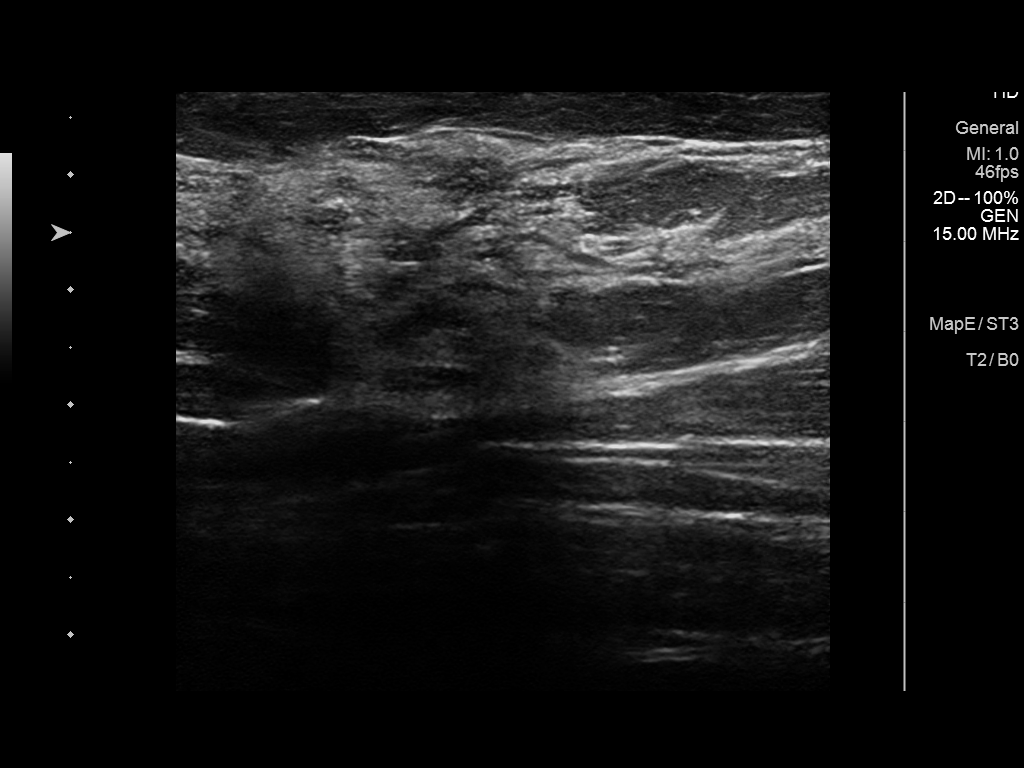
[im 4/4]
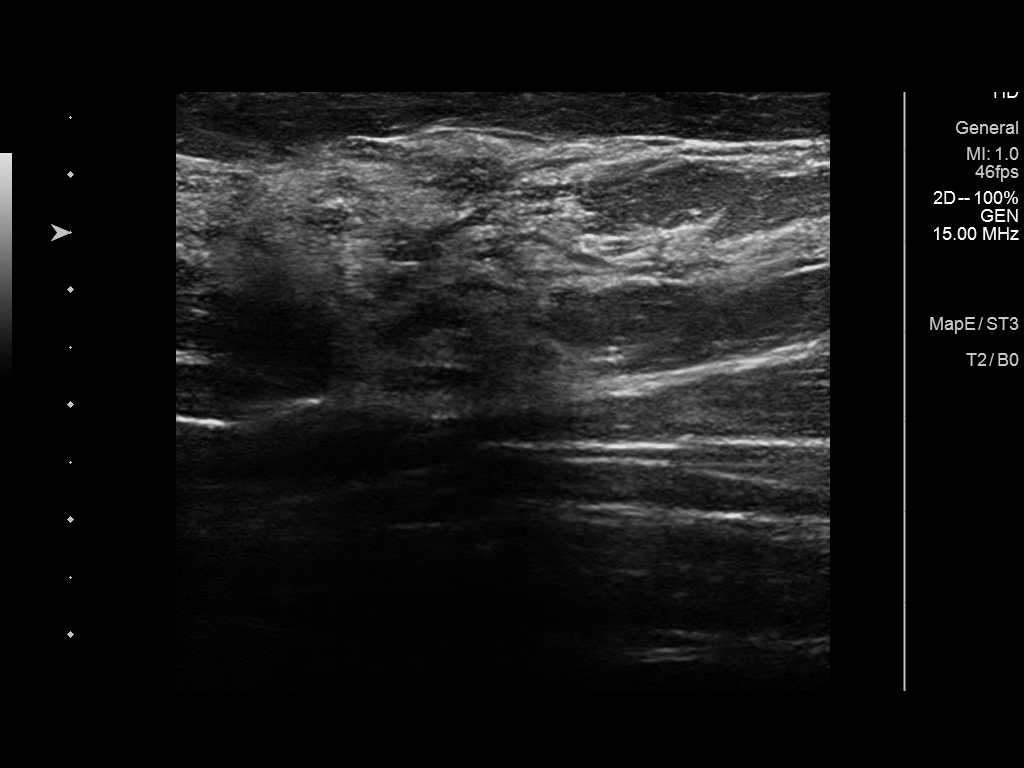

[4 of 4 positions shown; findings below may reference images not displayed]

ACR Breast Density Category c: The breast tissue is heterogeneously
dense, which may obscure small masses.
FINDINGS: Standard and tomosynthesis spot-compression CC and MLO views of the
area of concern in the right breast were obtained.

The focal asymmetry in the upper right breast at middle depth
disperses with compression and there is no underlying mass or
architectural distortion. Benign calcifications are present which
are unchanged dating back to at least 9848. Due to the extreme
density of the tissue in the area of concern, ultrasound is also
performed.

On physical exam, there is no palpable abnormality in the upper
right breast. The patient describes tenderness to palpation, though
she says this is not new.

Targeted right breast ultrasound is performed, showing normal dense
fibroglandular tissue in the upper breast in the area of concern on
screening mammography. No cyst, solid mass or abnormal acoustic
shadowing is identified.
IMPRESSION: No mammographic or sonographic evidence of malignancy involving the
right breast. The screening mammographic finding corresponds to
focally dense normal fibroglandular tissue.

RECOMMENDATION:
Screening mammogram in one year.(Code:2I-O-HML)

I have discussed the findings and recommendations with the patient.
Results were also provided in writing at the conclusion of the
visit. If applicable, a reminder letter will be sent to the patient
regarding the next appointment.

BI-RADS CATEGORY  1: Negative.

## 2018-08-13 IMAGING — MG MM DIGITAL DIAGNOSTIC UNILAT*R* W/ TOMO W/ CAD
6 series · 6 of 14 positions shown · non-contrast
Comparison: 03/24/2017, 11/23/2013 and earlier.

CLINICAL DATA: Recall from screening mammography with
tomosynthesis, possible mass or focal asymmetry in the upper right
breast at middle depth.

EXAM:
2D DIGITAL DIAGNOSTIC RIGHT MAMMOGRAM WITH ADJUNCT TOMO
ULTRASOUND RIGHT BREAST

[R MLO synth-2D]
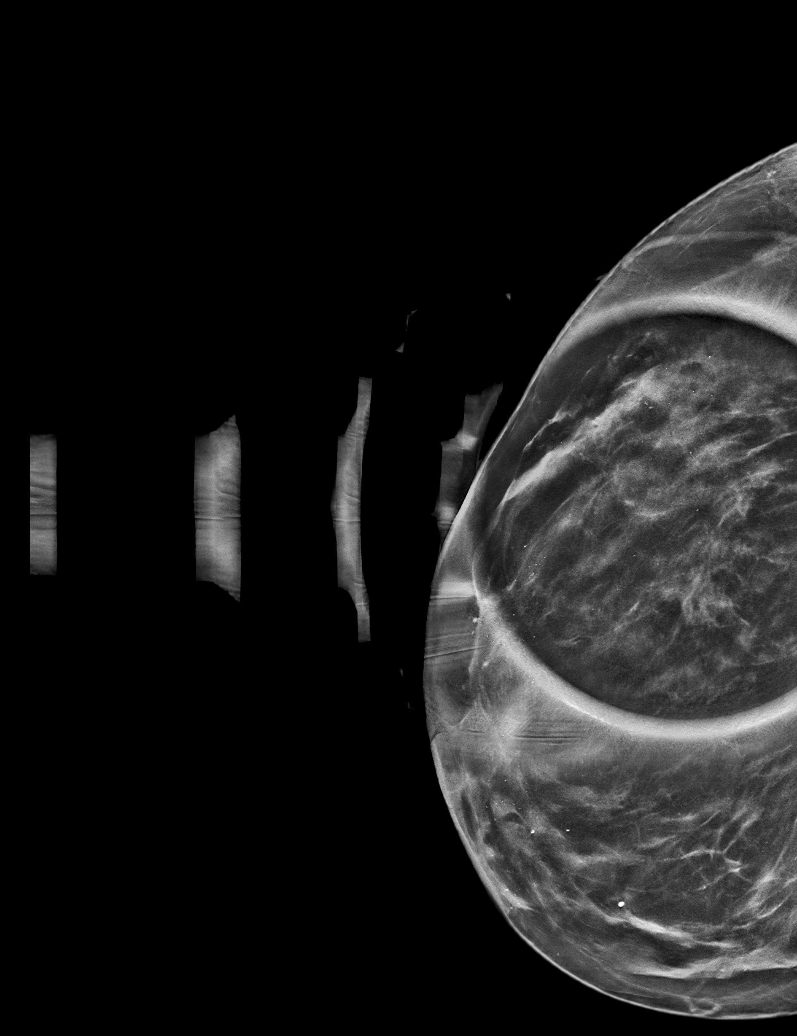

[R CC synth-2D]
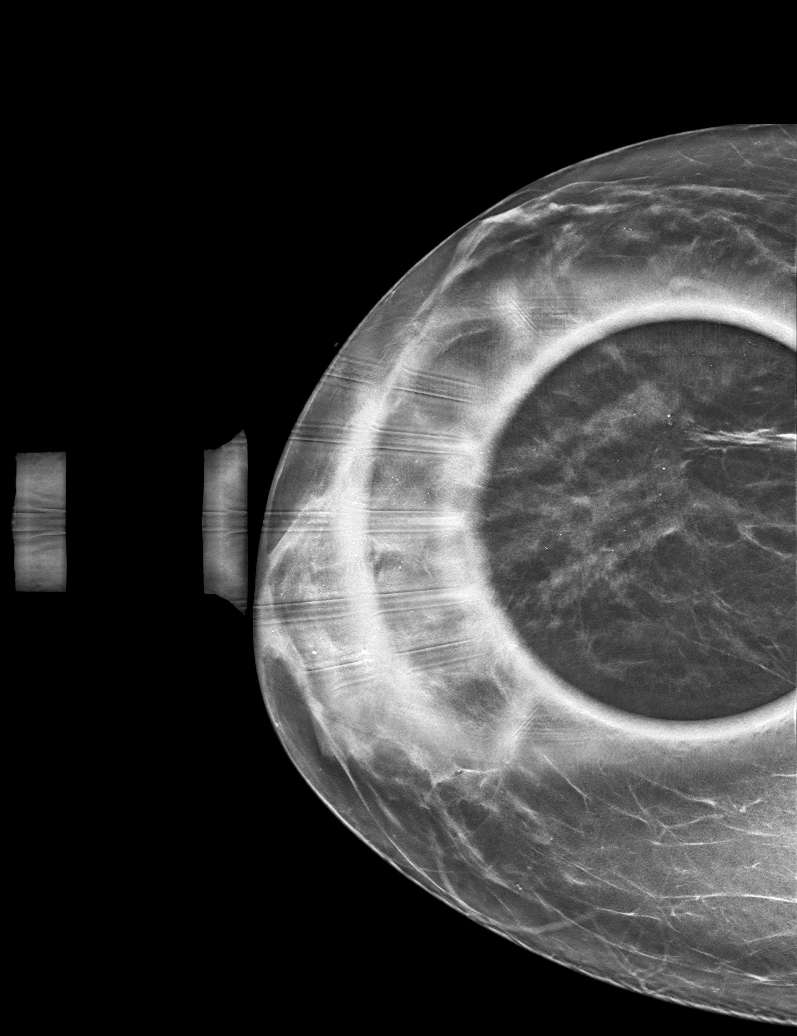

[R CC]
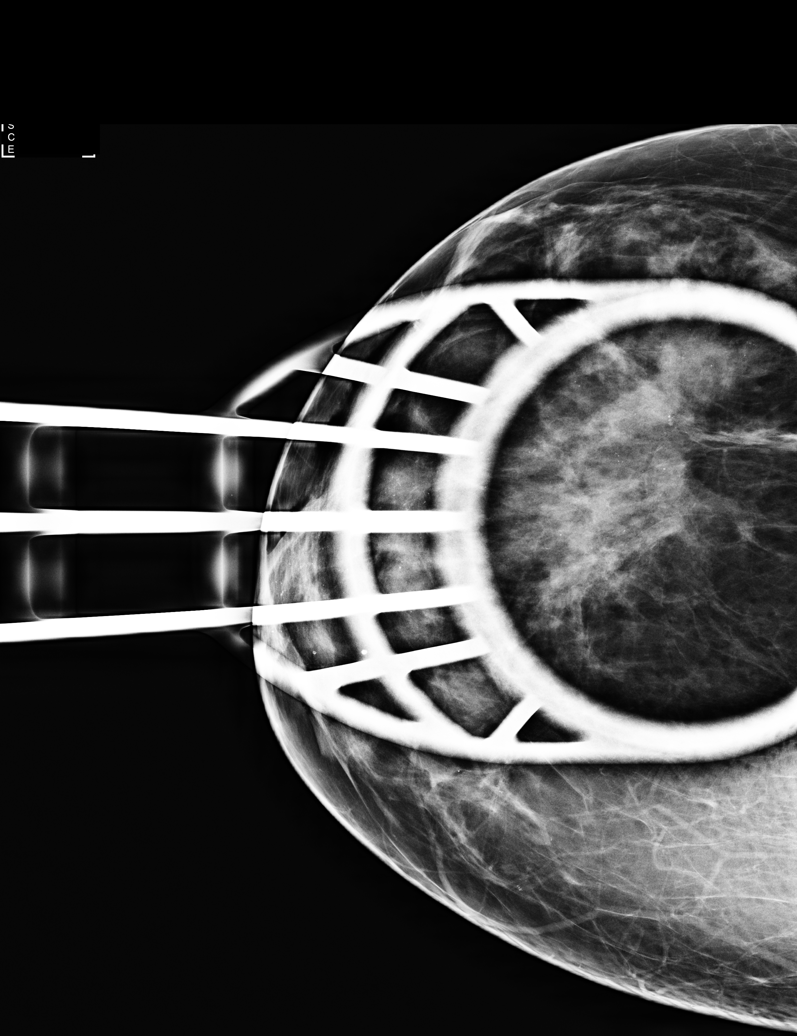

[R MLO]
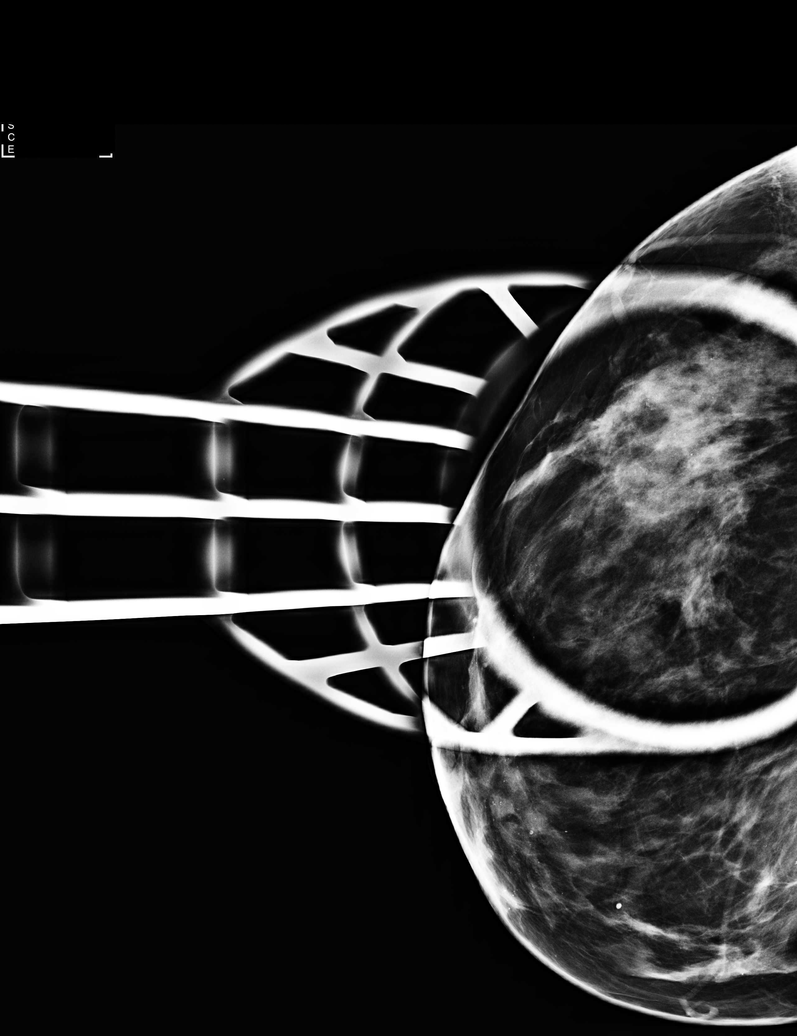

[R MLO tomo · tomo slice 30/59.0]
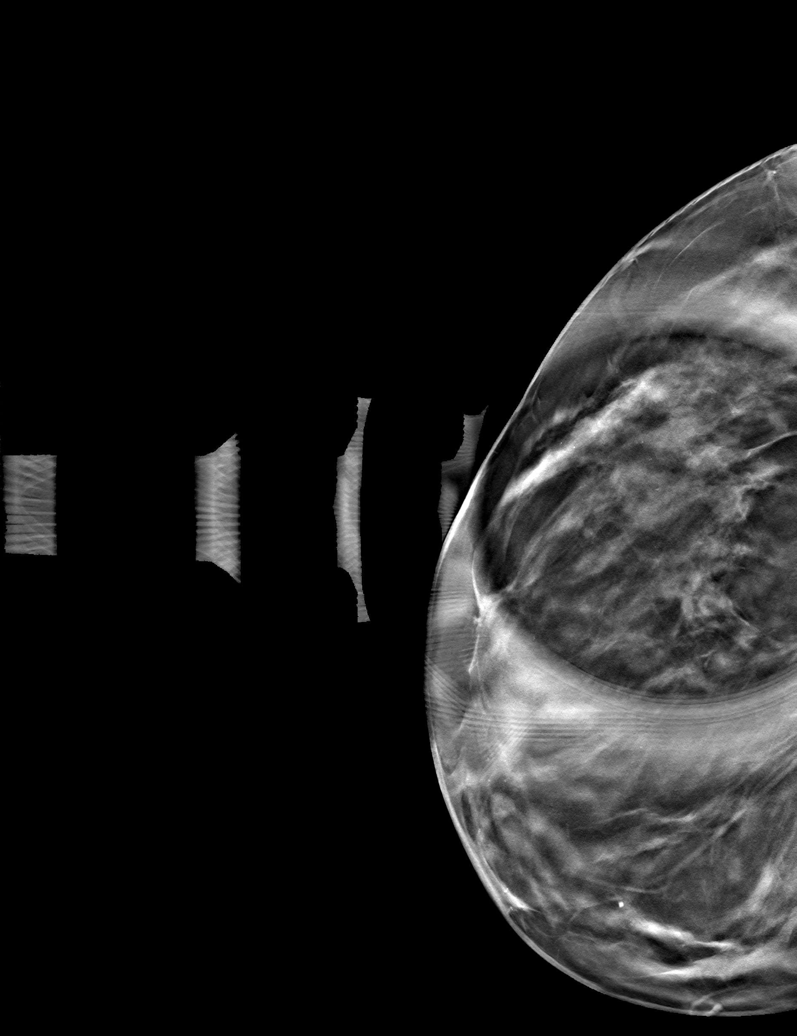

[R CC tomo · tomo slice 26/51.0]
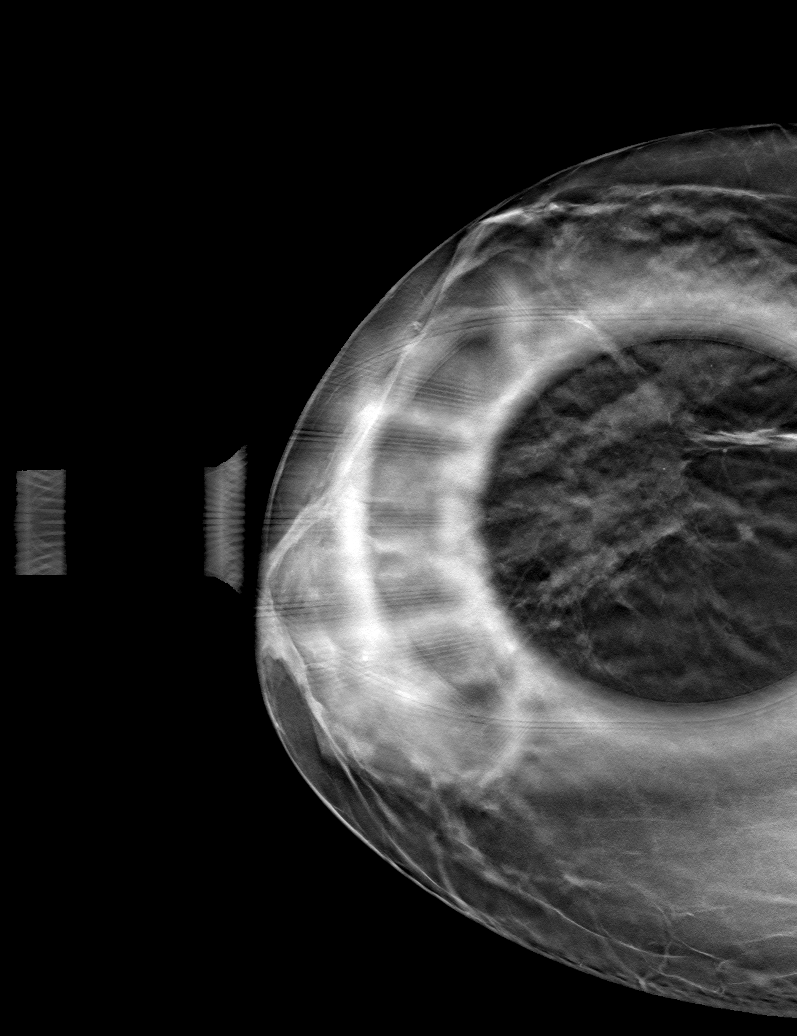

[6 of 14 positions shown; findings below may reference images not displayed]

ACR Breast Density Category c: The breast tissue is heterogeneously
dense, which may obscure small masses.
FINDINGS: Standard and tomosynthesis spot-compression CC and MLO views of the
area of concern in the right breast were obtained.

The focal asymmetry in the upper right breast at middle depth
disperses with compression and there is no underlying mass or
architectural distortion. Benign calcifications are present which
are unchanged dating back to at least 9848. Due to the extreme
density of the tissue in the area of concern, ultrasound is also
performed.

On physical exam, there is no palpable abnormality in the upper
right breast. The patient describes tenderness to palpation, though
she says this is not new.

Targeted right breast ultrasound is performed, showing normal dense
fibroglandular tissue in the upper breast in the area of concern on
screening mammography. No cyst, solid mass or abnormal acoustic
shadowing is identified.
IMPRESSION: No mammographic or sonographic evidence of malignancy involving the
right breast. The screening mammographic finding corresponds to
focally dense normal fibroglandular tissue.

RECOMMENDATION:
Screening mammogram in one year.(Code:2I-O-HML)

I have discussed the findings and recommendations with the patient.
Results were also provided in writing at the conclusion of the
visit. If applicable, a reminder letter will be sent to the patient
regarding the next appointment.

BI-RADS CATEGORY  1: Negative.

## 2019-12-21 ENCOUNTER — Other Ambulatory Visit: Payer: Self-pay | Admitting: Family Medicine

## 2019-12-21 DIAGNOSIS — Z1231 Encounter for screening mammogram for malignant neoplasm of breast: Secondary | ICD-10-CM

## 2020-01-17 ENCOUNTER — Other Ambulatory Visit: Payer: Self-pay

## 2020-01-17 ENCOUNTER — Ambulatory Visit
Admission: RE | Admit: 2020-01-17 | Discharge: 2020-01-17 | Disposition: A | Discharge status: Home / Self Care | Payer: Federal, State, Local not specified - PPO | Source: Ambulatory Visit | Attending: Family Medicine | Admitting: Family Medicine

## 2020-01-17 DIAGNOSIS — Z1231 Encounter for screening mammogram for malignant neoplasm of breast: Secondary | ICD-10-CM | POA: Diagnosis not present

## 2022-01-01 ENCOUNTER — Other Ambulatory Visit: Payer: Self-pay | Admitting: Physician Assistant

## 2022-01-01 DIAGNOSIS — Z1231 Encounter for screening mammogram for malignant neoplasm of breast: Secondary | ICD-10-CM

## 2022-01-28 ENCOUNTER — Ambulatory Visit
Admission: RE | Admit: 2022-01-28 | Discharge: 2022-01-28 | Disposition: A | Discharge status: Home / Self Care | Payer: Federal, State, Local not specified - PPO | Source: Ambulatory Visit | Attending: Physician Assistant | Admitting: Physician Assistant

## 2022-01-28 DIAGNOSIS — Z1231 Encounter for screening mammogram for malignant neoplasm of breast: Secondary | ICD-10-CM | POA: Diagnosis present

## 2022-07-11 ENCOUNTER — Telehealth: Payer: Self-pay

## 2022-07-11 ENCOUNTER — Other Ambulatory Visit: Payer: Self-pay

## 2022-07-11 DIAGNOSIS — Z1211 Encounter for screening for malignant neoplasm of colon: Secondary | ICD-10-CM

## 2022-07-11 MED ORDER — NA SULFATE-K SULFATE-MG SULF 17.5-3.13-1.6 GM/177ML PO SOLN: 1.0000 | Freq: Once | ORAL | 0 refills | Status: AC

## 2022-07-11 NOTE — Telephone Encounter (Signed)
Gastroenterology Pre-Procedure Review  Request Date: 08/18/22 Requesting Physician: Dr. Allen Norris  PATIENT REVIEW QUESTIONS: The patient responded to the following health history questions as indicated:    1. Are you having any GI issues? no 2. Do you have a personal history of Polyps? no 3. Do you have a family history of Colon Cancer or Polyps? no 4. Diabetes Mellitus? Yes takes insulin 5. Joint replacements in the past 12 months?no 6. Major health problems in the past 3 months?no 7. Any artificial heart valves, MVP, or defibrillator?no    MEDICATIONS & ALLERGIES:    Patient reports the following regarding taking any anticoagulation/antiplatelet therapy:   Plavix, Coumadin, Eliquis, Xarelto, Lovenox, Pradaxa, Brilinta, or Effient? no Aspirin? no  Patient confirms/reports the following medications:  Current Outpatient Medications  Medication Sig Dispense Refill   cephALEXin (KEFLEX) 500 MG capsule Take 1 capsule (500 mg total) by mouth 2 (two) times daily. 10 capsule 0   docusate sodium (COLACE) 100 MG capsule Take 1 capsule (100 mg total) by mouth 2 (two) times daily. To keep stools soft 30 capsule 0   FLUoxetine (PROZAC) 20 MG capsule Take 20 mg by mouth daily.     ibuprofen (ADVIL,MOTRIN) 200 MG tablet Take 800 mg by mouth every 6 (six) hours as needed for headache or moderate pain.      ibuprofen (ADVIL,MOTRIN) 800 MG tablet Take 1 tablet (800 mg total) by mouth every 8 (eight) hours as needed for moderate pain. 30 tablet 1   insulin aspart (NOVOLOG) 100 UNIT/ML injection Inject 90 Units into the skin See admin instructions. Use as direct in insulin pump up to 90 units per day TDD  ; 250.01     levothyroxine (SYNTHROID, LEVOTHROID) 88 MCG tablet Take 88 mcg by mouth daily before breakfast.      lisinopril (PRINIVIL,ZESTRIL) 20 MG tablet Take 20 mg by mouth daily.      simvastatin (ZOCOR) 40 MG tablet Take 40 mg by mouth daily at 6 PM.      No current facility-administered medications  for this visit.    Patient confirms/reports the following allergies:  Allergies  Allergen Reactions   Sulfa Antibiotics Rash and Other (See Comments)    Other Reaction: muscle ache    No orders of the defined types were placed in this encounter.   AUTHORIZATION INFORMATION Primary Insurance: 1D#: Group #:  Secondary Insurance: 1D#: Group #:  SCHEDULE INFORMATION: Date: 08/18/22 Time: Location: [place]

## 2022-07-21 ENCOUNTER — Other Ambulatory Visit: Payer: Self-pay | Admitting: Physician Assistant

## 2022-07-21 DIAGNOSIS — Z780 Asymptomatic menopausal state: Secondary | ICD-10-CM

## 2022-08-06 ENCOUNTER — Other Ambulatory Visit: Payer: Self-pay

## 2022-08-06 ENCOUNTER — Encounter: Payer: Self-pay | Admitting: Gastroenterology

## 2022-08-18 ENCOUNTER — Ambulatory Visit
Admission: RE | Admit: 2022-08-18 | Discharge: 2022-08-18 | Disposition: A | Discharge status: Home / Self Care | Payer: Federal, State, Local not specified - PPO | Attending: Gastroenterology | Admitting: Gastroenterology

## 2022-08-18 ENCOUNTER — Ambulatory Visit: Payer: Federal, State, Local not specified - PPO | Admitting: Anesthesiology

## 2022-08-18 ENCOUNTER — Other Ambulatory Visit: Payer: Self-pay

## 2022-08-18 ENCOUNTER — Encounter: Payer: Self-pay | Admitting: Gastroenterology

## 2022-08-18 ENCOUNTER — Encounter
Admission: RE | Disposition: A | Discharge status: Home / Self Care | Payer: Self-pay | Source: Home / Self Care | Attending: Gastroenterology

## 2022-08-18 DIAGNOSIS — K640 First degree hemorrhoids: Secondary | ICD-10-CM | POA: Diagnosis not present

## 2022-08-18 DIAGNOSIS — Z1211 Encounter for screening for malignant neoplasm of colon: Secondary | ICD-10-CM | POA: Diagnosis not present

## 2022-08-18 DIAGNOSIS — Z794 Long term (current) use of insulin: Secondary | ICD-10-CM | POA: Insufficient documentation

## 2022-08-18 DIAGNOSIS — K635 Polyp of colon: Secondary | ICD-10-CM

## 2022-08-18 DIAGNOSIS — K5730 Diverticulosis of large intestine without perforation or abscess without bleeding: Secondary | ICD-10-CM | POA: Diagnosis not present

## 2022-08-18 DIAGNOSIS — I10 Essential (primary) hypertension: Secondary | ICD-10-CM | POA: Insufficient documentation

## 2022-08-18 DIAGNOSIS — E1165 Type 2 diabetes mellitus with hyperglycemia: Secondary | ICD-10-CM | POA: Insufficient documentation

## 2022-08-18 DIAGNOSIS — Z87891 Personal history of nicotine dependence: Secondary | ICD-10-CM | POA: Insufficient documentation

## 2022-08-18 DIAGNOSIS — D122 Benign neoplasm of ascending colon: Secondary | ICD-10-CM | POA: Insufficient documentation

## 2022-08-18 DIAGNOSIS — Z86010 Personal history of colonic polyps: Secondary | ICD-10-CM | POA: Diagnosis present

## 2022-08-18 HISTORY — DX: Headache, unspecified: R51.9

## 2022-08-18 HISTORY — DX: Trigger finger, unspecified finger: M65.30

## 2022-08-18 HISTORY — PX: COLONOSCOPY WITH PROPOFOL: SHX5780

## 2022-08-18 HISTORY — DX: Hypothyroidism, unspecified: E03.9

## 2022-08-18 HISTORY — DX: Unspecified osteoarthritis, unspecified site: M19.90

## 2022-08-18 LAB — GLUCOSE, CAPILLARY: Glucose-Capillary: 249 mg/dL — ABNORMAL HIGH (ref 70–99)

## 2022-08-18 SURGERY — COLONOSCOPY WITH PROPOFOL
Anesthesia: General | Site: Rectum

## 2022-08-18 MED ORDER — LACTATED RINGERS IV SOLN: INTRAVENOUS | Status: DC

## 2022-08-18 MED ORDER — SODIUM CHLORIDE 0.9 % IV SOLN: INTRAVENOUS | Status: DC

## 2022-08-18 MED ORDER — STERILE WATER FOR IRRIGATION IR SOLN
Status: DC | PRN
  Administered 2022-08-18: 1

## 2022-08-18 MED ORDER — PROPOFOL 10 MG/ML IV BOLUS
INTRAVENOUS | Status: DC | PRN
  Administered 2022-08-18: 50 mg via INTRAVENOUS
  Administered 2022-08-18: 20 mg via INTRAVENOUS
  Administered 2022-08-18: 100 mg via INTRAVENOUS

## 2022-08-18 SURGICAL SUPPLY — 21 items

## 2022-08-18 NOTE — H&P (Signed)
Elaine Lame, MD [address] [place], [place] [address] Phone:[phone] Fax : [phone]  Primary Care Physician:  [facility] Primary Gastroenterologist:  Dr. Allen Norris  Pre-Procedure History & Physical: HPI:  Elaine Nelson is a 67 y.o. female is here for an colonoscopy.   Past Medical History:  Diagnosis Date   Abnormal vaginal bleeding    Arthritis    knees   Depression    Elevated lipids    Headache    from allergies   Hypertension    Hypothyroidism    Trigger finger    thumb left   Type 1 diabetes 1966    Past Surgical History:  Procedure Laterality Date   CATARACT EXTRACTION Bilateral 04/2014   COLONOSCOPY  03/2014   DILATATION & CURETTAGE/HYSTEROSCOPY WITH MYOSURE N/A 06/22/2017   Procedure: FRACTIONAL DILATATION & CURETTAGE/HYSTEROSCOPY WITH MYOSURE POLYPECTOMY;  Surgeon: Benjaman Kindler, MD;  Location: [facility];  Service: Gynecology;  Laterality: N/A;    Prior to Admission medications   Medication Sig Start Date End Date Taking? Authorizing Provider  AMLODIPINE BESYLATE PO Take 5 mg by mouth.   Yes [provider]  carvedilol (COREG) 6.25 MG tablet Take 6.25 mg by mouth 2 (two) times daily with a meal.   Yes [provider]  Cholecalciferol (VITAMIN D3) 50 MCG (2000 UT) CAPS Take by mouth daily.   Yes [provider]  ibuprofen (ADVIL,MOTRIN) 200 MG tablet Take 800 mg by mouth every 6 (six) hours as needed for headache or moderate pain.    Yes [provider]  ibuprofen (ADVIL,MOTRIN) 800 MG tablet Take 1 tablet (800 mg total) by mouth every 8 (eight) hours as needed for moderate pain. 06/22/17  Yes Benjaman Kindler, MD  insulin lispro (HUMALOG) 100 UNIT/ML injection Inject 30 Units into the skin 3 (three) times daily before meals. Has a insulin pump, usually wears on leg or stomach, 1 unit per 14 carbs, sugar goal 100-120, approximately 29 units/ day   Yes [provider]  levothyroxine  (SYNTHROID, LEVOTHROID) 88 MCG tablet Take 125 mcg by mouth daily before breakfast. 11/07/13  Yes [provider]  cephALEXin (KEFLEX) 500 MG capsule Take 1 capsule (500 mg total) by mouth 2 (two) times daily. Patient not taking: Reported on 08/06/2022 02/08/18   Crecencio Mc P, PA-C  docusate sodium (COLACE) 100 MG capsule Take 1 capsule (100 mg total) by mouth 2 (two) times daily. To keep stools soft Patient taking differently: Take 100 mg by mouth as needed. To keep stools soft 06/22/17   Benjaman Kindler, MD  FLUoxetine (PROZAC) 20 MG capsule Take 20 mg by mouth daily. Patient not taking: Reported on 08/06/2022    [provider]  insulin aspart (NOVOLOG) 100 UNIT/ML injection Inject 90 Units into the skin See admin instructions. Use as direct in insulin pump up to 90 units per day TDD  ; 250.01 Patient not taking: Reported on 08/06/2022 11/07/13   [provider]  lisinopril (PRINIVIL,ZESTRIL) 20 MG tablet Take 40 mg by mouth daily. 11/07/13 02/08/18  [provider]  simvastatin (ZOCOR) 40 MG tablet Take 40 mg by mouth daily at 6 PM.  11/07/13 02/08/18  [provider]    Allergies as of 07/11/2022 - Review Complete 07/11/2022  Allergen Reaction Noted   Sulfa antibiotics Rash and Other (See Comments) 04/03/2014    Family History  Problem Relation Age of Onset   Deep vein thrombosis Mother    Stroke Father  Breast cancer Maternal Aunt        mat great aunt   Breast cancer Cousin        2 pat cousins and one mat cousin    Social History   Socioeconomic History   Marital status: Married    Spouse name: Not on file   Number of children: Not on file   Years of education: Not on file   Highest education level: Not on file  Occupational History   Not on file  Tobacco Use   Smoking status: Former    Packs/day: 0.50    Years: 16.00    Additional pack years: 0.00    Total pack years: 8.00    Types: Cigarettes   Smokeless tobacco: Never   Vaping Use   Vaping Use: Never used  Substance and Sexual Activity   Alcohol use: Yes    Comment: occasionally, 2 x a year   Drug use: No   Sexual activity: Not on file  Other Topics Concern   Not on file  Social History Narrative   Not on file   Social Determinants of Health   Financial Resource Strain: Not on file  Food Insecurity: Not on file  Transportation Needs: Not on file  Physical Activity: Not on file  Stress: Not on file  Social Connections: Not on file  Intimate Partner Violence: Not on file    Review of Systems: See HPI, otherwise negative ROS  Physical Exam: BP 133/66   Pulse 77   Temp 97.9 F (36.6 C) (Temporal)   Resp 16   Ht 5\' 6"  (1.676 m)   Wt 87.5 kg   SpO2 99%   BMI 31.15 kg/m  General:   Alert,  pleasant and cooperative in NAD Head:  Normocephalic and atraumatic. Neck:  Supple; no masses or thyromegaly. Lungs:  Clear throughout to auscultation.    Heart:  Regular rate and rhythm. Abdomen:  Soft, nontender and nondistended. Normal bowel sounds, without guarding, and without rebound.   Neurologic:  Alert and  oriented x4;  grossly normal neurologically.  Impression/Plan: Elaine Nelson is here for an colonoscopy to be performed for a history of adenomatous polyps on 2015   Risks, benefits, limitations, and alternatives regarding  colonoscopy have been reviewed with the patient.  Questions have been answered.  All parties agreeable.   Elaine Lame, MD  08/18/2022, 8:44 AM

## 2022-08-18 NOTE — Addendum Note (Signed)
Addendum  created 08/18/22 1410 by Helayne Seminole, MD   Attestation recorded in [place], [place] filed

## 2022-08-18 NOTE — Anesthesia Postprocedure Evaluation (Signed)
Anesthesia Post Note  Patient: Elaine Nelson  Procedure(s) Performed: COLONOSCOPY WITH PROPOFOL (Rectum)  Patient location during evaluation: Phase II Anesthesia Type: General Level of consciousness: awake and alert Pain management: satisfactory to patient Vital Signs Assessment: post-procedure vital signs reviewed and stable Respiratory status: spontaneous breathing Cardiovascular status: stable Anesthetic complications: no   No notable events documented.   Last Vitals:  Vitals:   08/18/22 0946 08/18/22 0948  BP: 120/60   Pulse: 70 79  Resp: 16 18  Temp:    SpO2: 100% 96%    Last Pain:  Vitals:   08/18/22 0946  TempSrc:   PainSc: 0-No pain                 Elaine Nelson C Lilo [place]

## 2022-08-18 NOTE — Op Note (Signed)
[facility] Gastroenterology Patient Name: Elaine Nelson Procedure Date: 08/18/2022 8:58 AM MRN: [id number] Account #: [id number] Date of Birth: 1956-02-16 Admit Type: Outpatient Age: 67 Room: [facility] OR ROOM 01 Gender: Female Note Status: Finalized Instrument Name: [id number] Procedure:             Colonoscopy Indications:           High risk colon cancer surveillance: Personal history                         of colonic polyps Providers:             Lucilla Lame MD, MD Referring MD:          [facility] [place] (Referring MD) Medicines:             Propofol per Anesthesia Complications:         No immediate complications. Procedure:             Pre-Anesthesia Assessment:                        - Prior to the procedure, a History and Physical was                         performed, and patient medications and allergies were                         reviewed. The patient's tolerance of previous                         anesthesia was also reviewed. The risks and benefits                         of the procedure and the sedation options and risks                         were discussed with the patient. All questions were                         answered, and informed consent was obtained. Prior                         Anticoagulants: The patient has taken no anticoagulant                         or antiplatelet agents. ASA Grade Assessment: II - A                         patient with mild systemic disease. After reviewing                         the risks and benefits, the patient was deemed in                         satisfactory condition to undergo the procedure.                        After obtaining informed consent, the colonoscope was  passed under direct vision. Throughout the procedure,                         the patient's blood pressure, pulse, and oxygen                         saturations were monitored continuously. The                          Colonoscope was introduced through the anus and                         advanced to the the cecum, identified by appendiceal                         orifice and ileocecal valve. The colonoscopy was                         performed without difficulty. The patient tolerated                         the procedure well. The quality of the bowel                         preparation was excellent. Findings:      The perianal and digital rectal examinations were normal.      An 8 mm polyp was found in the ileocecal valve. The polyp was sessile.       The polyp was removed with a cold snare. Resection and retrieval were       complete.      Multiple small-mouthed diverticula were found in the entire colon.      Non-bleeding internal hemorrhoids were found during retroflexion. The       hemorrhoids were Grade I (internal hemorrhoids that do not prolapse). Impression:            - One 8 mm polyp at the ileocecal valve, removed with                         a cold snare. Resected and retrieved.                        - Diverticulosis in the entire examined colon.                        - Non-bleeding internal hemorrhoids. Recommendation:        - Discharge patient to home.                        - Resume previous diet.                        - Continue present medications.                        - Await pathology results.                        - Repeat colonoscopy in 7 years for surveillance. Procedure Code(s):     --- Professional ---  45385, Colonoscopy, flexible; with removal of                         tumor(s), polyp(s), or other lesion(s) by snare                         technique Diagnosis Code(s):     --- Professional ---                        Z86.010, Personal history of colonic polyps                        D12.0, Benign neoplasm of cecum CPT copyright 2022 American Medical Association. All rights reserved. The codes documented in this report are preliminary  and upon coder review may  be revised to meet current compliance requirements. Lucilla Lame MD, MD 08/18/2022 9:37:12 AM This report has been signed electronically. Number of Addenda: 0 Note Initiated On: 08/18/2022 8:58 AM Scope Withdrawal Time: 0 hours 9 minutes 42 seconds  Total Procedure Duration: 0 hours 15 minutes 5 seconds  Estimated Blood Loss:  Estimated blood loss: none.      [facility]

## 2022-08-18 NOTE — Transfer of Care (Signed)
Immediate Anesthesia Transfer of Care Note  Patient: Elaine Nelson  Procedure(s) Performed: COLONOSCOPY WITH PROPOFOL (Rectum)  Patient Location: PACU  Anesthesia Type: General  Level of Consciousness: awake, alert  and patient cooperative  Airway and Oxygen Therapy: Patient Spontanous Breathing and Patient connected to supplemental oxygen  Post-op Assessment: Post-op Vital signs reviewed, Patient's Cardiovascular Status Stable, Respiratory Function Stable, Patent Airway and No signs of Nausea or vomiting  Post-op Vital Signs: Reviewed and stable  Complications: No notable events documented.

## 2022-08-18 NOTE — Anesthesia Preprocedure Evaluation (Addendum)
Anesthesia Evaluation  Patient identified by MRN, date of birth, ID band Patient awake    Reviewed: reviewed documented beta blocker date and time   Airway Mallampati: II  TM Distance: >3 FB Neck ROM: Full    Dental no notable dental hx.    Pulmonary former smoker   Pulmonary exam normal        Cardiovascular Exercise Tolerance: Good hypertension, Pt. on medications  Rhythm:Regular Rate:Normal     Neuro/Psych negative neurological ROS  negative psych ROS   GI/Hepatic negative GI ROS, Neg liver ROS,,,  Endo/Other  diabetes, Poorly Controlled, Insulin Dependent    Renal/GU negative Renal ROS     Musculoskeletal negative musculoskeletal ROS (+)    Abdominal  (+) + obese Abdomen: soft. Bowel sounds: normal.  Peds  Hematology negative hematology ROS (+)   Anesthesia Other Findings   Reproductive/Obstetrics negative OB ROS                             Anesthesia Physical Anesthesia Plan  ASA: 3  Anesthesia Plan: General   Post-op Pain Management:    Induction:   PONV Risk Score and Plan: 1  Airway Management Planned: Nasal Cannula  Additional Equipment:   Intra-op Plan:   Post-operative Plan:   Informed Consent: I have reviewed the patients History and Physical, chart, labs and discussed the procedure including the risks, benefits and alternatives for the proposed anesthesia with the patient or authorized representative who has indicated his/her understanding and acceptance.       Plan Discussed with: CRNA  Anesthesia Plan Comments:        Anesthesia Quick Evaluation

## 2022-08-18 NOTE — Addendum Note (Signed)
Addendum  created 08/18/22 1412 by Helayne Seminole, MD   Attestation recorded in [place], [place] filed

## 2022-08-19 ENCOUNTER — Encounter: Payer: Self-pay | Admitting: Gastroenterology

## 2022-08-20 LAB — SURGICAL PATHOLOGY

## 2022-08-21 ENCOUNTER — Encounter: Payer: Self-pay | Admitting: Gastroenterology

## 2022-09-02 ENCOUNTER — Ambulatory Visit
Admission: RE | Admit: 2022-09-02 | Discharge: 2022-09-02 | Disposition: A | Discharge status: Home / Self Care | Payer: Federal, State, Local not specified - PPO | Source: Ambulatory Visit | Attending: Physician Assistant | Admitting: Physician Assistant

## 2022-09-02 DIAGNOSIS — Z780 Asymptomatic menopausal state: Secondary | ICD-10-CM

## 2022-10-06 ENCOUNTER — Telehealth: Payer: Self-pay

## 2022-10-06 NOTE — Telephone Encounter (Signed)
Patient left a voicemail and had a colonoscopy recently and has had constipation since colonoscopy. Wants to know what she needs to do.

## 2022-10-08 ENCOUNTER — Telehealth: Payer: Self-pay | Admitting: Gastroenterology

## 2022-10-08 NOTE — Telephone Encounter (Signed)
Pt expressed understanding, nothing further needed at this time

## 2022-10-08 NOTE — Telephone Encounter (Signed)
Pt left vmm having stomach problems since procedure on 08/18/2022 Please return call

## 2023-10-29 ENCOUNTER — Other Ambulatory Visit: Payer: Self-pay | Admitting: Physician Assistant

## 2023-10-29 DIAGNOSIS — Z1231 Encounter for screening mammogram for malignant neoplasm of breast: Secondary | ICD-10-CM

## 2023-12-09 ENCOUNTER — Ambulatory Visit
Admission: RE | Admit: 2023-12-09 | Discharge: 2023-12-09 | Disposition: A | Discharge status: Home / Self Care | Source: Ambulatory Visit | Attending: Physician Assistant | Admitting: Physician Assistant

## 2023-12-09 DIAGNOSIS — Z1231 Encounter for screening mammogram for malignant neoplasm of breast: Secondary | ICD-10-CM | POA: Diagnosis present
